# Patient Record
Sex: Male | Born: 1955 | Race: Black or African American | Hispanic: No | Marital: Single | Smoking: Never smoker
Health system: Southern US, Community
[De-identification: ages and names within clinical notes are randomized; demographics above are authoritative.]

## PROBLEM LIST (undated history)

## (undated) DIAGNOSIS — I10 Essential (primary) hypertension: Secondary | ICD-10-CM

## (undated) DIAGNOSIS — E119 Type 2 diabetes mellitus without complications: Secondary | ICD-10-CM

## (undated) DIAGNOSIS — E7800 Pure hypercholesterolemia, unspecified: Secondary | ICD-10-CM

## (undated) HISTORY — PX: APPENDECTOMY: SHX54

---

## 2001-02-28 ENCOUNTER — Inpatient Hospital Stay (HOSPITAL_COMMUNITY): Admission: EM | Admit: 2001-02-28 | Discharge: 2001-03-01 | Payer: Self-pay | Admitting: *Deleted

## 2001-03-03 ENCOUNTER — Other Ambulatory Visit (HOSPITAL_COMMUNITY): Admission: RE | Admit: 2001-03-03 | Discharge: 2001-03-05 | Payer: Self-pay | Admitting: Psychiatry

## 2001-06-11 ENCOUNTER — Emergency Department (HOSPITAL_COMMUNITY): Admission: EM | Admit: 2001-06-11 | Discharge: 2001-06-11 | Payer: Self-pay | Admitting: Emergency Medicine

## 2008-06-21 ENCOUNTER — Emergency Department (HOSPITAL_COMMUNITY): Admission: EM | Admit: 2008-06-21 | Discharge: 2008-06-22 | Payer: Self-pay | Admitting: Emergency Medicine

## 2011-02-07 NOTE — Discharge Summary (Signed)
[facility]  Patient:    Corey Christensen, Corey Christensen                      MRN: [id number] Adm. Date:  [id number] Disc. Date: [id number] Attending:  Benjaman Pott CC:         Intensive Outpatient Program   Discharge Summary  HISTORY OF PRESENT ILLNESS:  The patient was a 55 year old African-American male admitted on a voluntary basis due to depression.  The patient states he has been sad and hopeless for the past two weeks.  He has had multiple neurovegetative symptoms including anergia, anhedonia, difficulty concentrating, and feelings of worthlessness.  He is having conflict with this wife, which is causing stress.  He has left home for two days prior to admission, and been staying in a motel.  He had written a suicide note to his wife.  However, later, he stated that he did not really want to harm himself. There was no auditory or visual hallucinations.  PAST PSYCHIATRIC HISTORY:  None.  FAMILY HISTORY:  None.  SUBSTANCE ABUSE HISTORY:  Occasional drinker.  Occasional marijuana, last use two weeks ago.  PAST MEDICAL HISTORY:  The patient is status post knee surgery in February.  MEDICATIONS:  None.  DRUG ALLERGIES:  No known drug allergies.  For further assessment information, see psychiatric admission assessment.  PHYSICAL EXAMINATION:  This was performed at [facility].  There were no significant abnormalities.  ADMISSION LABORATORY:  Most were performed at [facility] and were grossly within normal limits.  UDS was noted to be positive for marijuana.  A hepatic profile was within normal limits, except for an elevated total protein of 8.2 (6-8), hypothyroid panel was within normal limits.  HOSPITAL COURSE:  The patient stayed for brief crisis stabilization.  He had a family session with his wife for support.  This went well.  He was not started on any medication.  He mood had improved significantly with the family  session intervention.  He was felt to be safe to be managed in an outpatient setting at the time of discharge.  DISCHARGE MENTAL STATUS EXAMINATION:  The patient was cooperative, pleasant, with good eye contact, alert, well-developed, African-American male.  Speech was normal rate and flow.  Psychomotor activities within normal limits.  Mood is euthymic.  Affect is superficially bright.  No homicidal or suicidal ideation.  The thought processes are logical and goal-directed.  No evidence of psychosis or auditory or visual hallucinations.  Cognitive exam intact. Judgment good.  Insight good.  DISCHARGE DIAGNOSES: Axis I:    Major depression, single, moderate, without psychosis. Axis II:   None. Axis III:  Status post knee arthroscopy. Axis IV:   Severe. Axis V:    Global Assessment of Functioning, admission, was 30, highest in the            past year was 80, at discharge is 50.  DISCHARGE MEDICATIONS:  None.  ACTIVITY LEVEL:  No activity restrictions.  DIET:  No dietary restrictions.  POSTHOSPITAL CARE PLAN:  IOP at the [facility]. DD:  04/01/01 TD:  04/01/01 Job: [id number]

## 2011-02-07 NOTE — H&P (Signed)
[facility]  Patient:    Corey Christensen, Corey Christensen                       MRN: [id number] Adm. Date:  [id number] Attending:  Jasmine Pang Dictator:   Candi Leash. Theressa Stamps, N.P.                   Psychiatric Admission Assessment  CHIEF COMPLAINT:  February 28, 2001  PATIENT IDENTIFICATION:  This is a 55 year old married black male voluntarily admitted to [facility] on February 28, 2001, for depression.  HISTORY OF PRESENT ILLNESS:  The patient presents with a history of depression for approximately two weeks, feeling "bad," sad, and hopeless.  The patient had written three letters to his wife, children, and parents.  His significant trigger to his depression has been conflicts with his wife.  The patient left home a couple of days prior to this admission, had stayed in [place].  He had written letters as stated and his wife had found them.  They imply suicidal ideation.  The patient knew that he needed some help.  He felt that they should have gotten counseling before.  He has been sleeping well.  His appetite is good.  He denies any auditory or visual hallucinations, denies any paranoid ideations.  He reports no other suicide attempts.  He states that he loves his family dearly and that he sees them as a deterrent to ever harming himself.  PAST PSYCHIATRIC HISTORY:  This is his first hospitalization.  He has no outpatient treatment.  SUBSTANCE ABUSE HISTORY:  He is a nonsmoker, occasional drinker, will have a couple of beers watching a sporting event.  He uses marijuana occasionally, last use two weeks ago.  PAST MEDICAL HISTORY:  Primary care [facility] is Dr. Jodelle Green in [place]. Medical problems: Status post arthroscopy in February 2002.  Medications: None.  Drug allergies: No known drug allergies.  Physical examination was performed at [facility].  Alcohol level was less than 9.  Urinalysis was within normal limits.  CMET: Within normal limits.   CBC was within normal limits.  Urine drug screen was positive for marijuana.  SOCIAL HISTORY:  He is a 55 year old married black male for 15 years.  He has three children ages 65, 35, and 47.  He lives with his children and spouse. 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  He has completed two years of community college.  He has no legal problems.  Family history: None.  MENTAL STATUS EXAMINATION:  Alert, well-developed black middle-aged male, pleasant, cooperative with good eye contact.  Speech is normal and relevant. Mood: "Feels good."  Affect is superficially bright.  Thought processes are coherent, no evidence of psychosis, no auditory or visual hallucinations, no suicidal or homicidal ideations, no paranoia.  Cognitive functioning is intact. Very goal-directed.  Memory is good.  Judgment is good.  Insight is fair.  Appears reliable.  ADMISSION DIAGNOSES: Axis I:    Major depression. Axis II:   Deferred. Axis III:  Status post knee arthroscopy. Axis IV:   Severe with problems relating to primary support group. Axis V:    Current is 30, past year 26.  INITIAL PLAN OF CARE:  Voluntary admission to [facility] for depression and suicidal ideation.  Contract for safety, check every 15 minutes; the patient promises safety.  Will discuss the use of antidepressant to decrease his depressive symptoms.  The patient does  not want to start at this point in time.  Consider family session for support.  Plan is to return to the patient to his prior living arrangement, for the patient to attend the IOP Program and couples counseling.  ESTIMATED LENGTH OF STAY:  Three to four days. DD:  03/01/01 TD:  03/01/01 Job: [id number]

## 2012-06-14 ENCOUNTER — Other Ambulatory Visit: Payer: Self-pay

## 2012-06-14 ENCOUNTER — Telehealth: Payer: Self-pay

## 2012-06-14 DIAGNOSIS — Z139 Encounter for screening, unspecified: Secondary | ICD-10-CM

## 2012-06-14 NOTE — Telephone Encounter (Signed)
MOVI PREP SPLIT DOSING, REGULAR BREAKFAST. CLEAR LIQUIDS AFTER 9 AM.  

## 2012-06-14 NOTE — Telephone Encounter (Addendum)
Gastroenterology Pre-Procedure Form  Pt had hx of drinking 1-2 beers on week-end. [place] Spaniel he has not drank in about a month.  Request Date: 06/14/2012        Requesting Physician: 06/29/2012     PATIENT INFORMATION:  Corey Christensen is a 56 y.o., male (DOB=Jun 03, 1956).  PROCEDURE: Procedure(s) requested: colonoscopy Procedure Reason: screening for colon cancer  PATIENT REVIEW QUESTIONS: The patient reports the following:   1. Diabetes Melitis: no 2. Joint replacements in the past 12 months: no 3. Major health problems in the past 3 months: no 4. Has an artificial valve or MVP:no 5. Has been advised in past to take antibiotics in advance of a procedure like teeth cleaning: no}    MEDICATIONS & ALLERGIES:    Patient reports the following regarding taking any blood thinners:   Plavix? no Aspirin?no Coumadin?  no  Patient confirms/reports the following medications:  Current Outpatient Prescriptions  Medication Sig Dispense Refill  . atenolol (TENORMIN) 50 MG tablet Take 50 mg by mouth daily.      Marland Kitchen lisinopril-hydrochlorothiazide (PRINZIDE,ZESTORETIC) 20-25 MG per tablet Take 1 tablet by mouth daily.      . pravastatin (PRAVACHOL) 20 MG tablet Take 20 mg by mouth daily.        Patient confirms/reports the following allergies:  No Known Allergies  Patient is appropriate to schedule for requested procedure(s): Yes  AUTHORIZATION INFORMATION Primary Insurance:   ID #: Group #:  Pre-Cert / Auth required Pre-Cert / Auth #:   Secondary Insurance:   ID #:   Group #:  Pre-Cert / Auth required: Pre-Cert / Auth #:   No orders of the defined types were placed in this encounter.    SCHEDULE INFORMATION: Procedure has been scheduled as follows:  Date: 06/29/2012     Time: 12:20 PM  Location: [facility] Short Stay  This Gastroenterology Pre-Precedure Form is being routed to the following provider(s) for review: Jonette Eva, MD

## 2012-06-15 MED ORDER — PEG-KCL-NACL-NASULF-NA ASC-C 100 G PO SOLR: 1.0000 | ORAL | Status: DC

## 2012-06-15 NOTE — Telephone Encounter (Signed)
Rx sent to [facility] and Instructions mailed to pt.  

## 2012-06-25 ENCOUNTER — Telehealth: Payer: Self-pay

## 2012-06-25 ENCOUNTER — Encounter (HOSPITAL_COMMUNITY): Payer: Self-pay | Admitting: Pharmacy Technician

## 2012-06-25 NOTE — Telephone Encounter (Signed)
I called LT ( last initial of G) on 06/17/2012 and he said that PA is not required for screening colonoscopy.

## 2012-06-28 ENCOUNTER — Telehealth: Payer: Self-pay

## 2012-06-28 MED ORDER — SODIUM CHLORIDE 0.45 % IV SOLN
INTRAVENOUS | Status: DC
  Administered 2012-06-29: 12:00:00 via INTRAVENOUS

## 2012-06-28 NOTE — Telephone Encounter (Signed)
LATE ENTRY;   I called and spoke to CT with last initial of G on 06/17/2012 and he said PA was not required for screening colonoscopy.

## 2012-06-29 ENCOUNTER — Encounter (HOSPITAL_COMMUNITY)
Admission: RE | Disposition: A | Discharge status: Home / Self Care | Payer: Self-pay | Source: Ambulatory Visit | Attending: Gastroenterology

## 2012-06-29 ENCOUNTER — Ambulatory Visit (HOSPITAL_COMMUNITY)
Admission: RE | Admit: 2012-06-29 | Discharge: 2012-06-29 | Disposition: A | Discharge status: Home / Self Care | Payer: No Typology Code available for payment source | Source: Ambulatory Visit | Attending: Gastroenterology | Admitting: Gastroenterology

## 2012-06-29 ENCOUNTER — Encounter (HOSPITAL_COMMUNITY): Payer: Self-pay | Admitting: *Deleted

## 2012-06-29 DIAGNOSIS — Z1211 Encounter for screening for malignant neoplasm of colon: Secondary | ICD-10-CM | POA: Insufficient documentation

## 2012-06-29 DIAGNOSIS — E7800 Pure hypercholesterolemia, unspecified: Secondary | ICD-10-CM | POA: Insufficient documentation

## 2012-06-29 DIAGNOSIS — Z139 Encounter for screening, unspecified: Secondary | ICD-10-CM

## 2012-06-29 DIAGNOSIS — K648 Other hemorrhoids: Secondary | ICD-10-CM | POA: Insufficient documentation

## 2012-06-29 DIAGNOSIS — I10 Essential (primary) hypertension: Secondary | ICD-10-CM | POA: Insufficient documentation

## 2012-06-29 HISTORY — PX: COLONOSCOPY: SHX5424

## 2012-06-29 HISTORY — DX: Pure hypercholesterolemia, unspecified: E78.00

## 2012-06-29 HISTORY — DX: Essential (primary) hypertension: I10

## 2012-06-29 SURGERY — COLONOSCOPY
Anesthesia: Moderate Sedation

## 2012-06-29 MED ORDER — MIDAZOLAM HCL 5 MG/5ML IJ SOLN
INTRAMUSCULAR | Status: AC
  Filled 2012-06-29: qty 10

## 2012-06-29 MED ORDER — MIDAZOLAM HCL 5 MG/5ML IJ SOLN
INTRAMUSCULAR | Status: DC | PRN
  Administered 2012-06-29 (×3): 2 mg via INTRAVENOUS

## 2012-06-29 MED ORDER — MEPERIDINE HCL 100 MG/ML IJ SOLN
INTRAMUSCULAR | Status: DC | PRN
  Administered 2012-06-29 (×3): 25 mg via INTRAVENOUS

## 2012-06-29 MED ORDER — STERILE WATER FOR IRRIGATION IR SOLN
Status: DC | PRN
  Administered 2012-06-29: 12:00:00

## 2012-06-29 MED ORDER — MEPERIDINE HCL 100 MG/ML IJ SOLN
INTRAMUSCULAR | Status: AC
  Filled 2012-06-29: qty 2

## 2012-06-29 NOTE — H&P (Signed)
  Primary Care Physician:  Evlyn Courier, MD Primary Gastroenterologist:  Dr. Darrick Penna  Pre-Procedure History & Physical: HPI:  Corey Christensen is a 56 y.o. male here for COLON CANCER SCREENING.   Past Medical History  Diagnosis Date  . Hypertension   . Hypercholesteremia     Past Surgical History  Procedure Date  . Appendectomy     Prior to Admission medications   Medication Sig Start Date End Date Taking? Authorizing Provider  aspirin EC 81 MG tablet Take 81 mg by mouth daily.   Yes Historical Provider, MD  atenolol (TENORMIN) 50 MG tablet Take 50 mg by mouth daily.   Yes Historical Provider, MD  lisinopril-hydrochlorothiazide (PRINZIDE,ZESTORETIC) 20-25 MG per tablet Take 1 tablet by mouth daily.   Yes Historical Provider, MD  pravastatin (PRAVACHOL) 20 MG tablet Take 20 mg by mouth daily.   Yes Historical Provider, MD    Allergies as of 06/14/2012  . (No Known Allergies)    Family History  Problem Relation Age of Onset  . Colon cancer Neg Hx     History   Social History  . Marital Status: Single    Spouse Name: N/A    Number of Children: N/A  . Years of Education: N/A   Occupational History  . Not on file.   Social History Main Topics  . Smoking status: Never Smoker   . Smokeless tobacco: Not on file  . Alcohol Use: No  . Drug Use: No  . Sexually Active:    Other Topics Concern  . Not on file   Social History Narrative  . No narrative on file    Review of Systems: See HPI, otherwise negative ROS   Physical Exam: BP 136/85  Pulse 58  Temp 97.9 F (36.6 C) (Oral)  Resp 23  Ht 6' (1.829 m)  Wt 197 lb (89.359 kg)  BMI 26.72 kg/m2  SpO2 93% General:   Alert,  pleasant and cooperative in NAD Head:  Normocephalic and atraumatic. Neck:  Supple; Lungs:  Clear throughout to auscultation.    Heart:  Regular rate and rhythm. Abdomen:  Soft, nontender and nondistended. Normal bowel sounds, without guarding, and without rebound.   Neurologic:  Alert  and  oriented x4;  grossly normal neurologically.  Impression/Plan:     SCREENING  Plan:  1. TCS TODAY

## 2012-06-30 NOTE — Op Note (Signed)
[facility] [address] [place], [address]   COLONOSCOPY PROCEDURE REPORT  PATIENT: Corey Christensen, Corey Christensen  MR#: [id number] BIRTHDATE: [birth year] , 55  yrs. old GENDER: Male ENDOSCOPIST: Jonette Eva, MD REFERRED MW:UXLKGM Hill, M.D. PROCEDURE DATE:  06/29/2012 PROCEDURE:   Colonoscopy, screening INDICATIONS:average risk patient for colon cancer. MEDICATIONS: Demerol 75 mg IV and Versed 6 mg IV  DESCRIPTION OF PROCEDURE:    Physical exam was performed.  Informed consent was obtained from the patient after explaining the benefits, risks, and alternatives to procedure.  The patient was connected to monitor and placed in left lateral position. Continuous oxygen was provided by nasal cannula and IV medicine administered through an indwelling cannula.  After administration of sedation and rectal exam, the patients rectum was intubated and the Pentax Colonoscope [id number]  colonoscope was advanced under direct visualization to the cecum.  The scope was removed slowly by carefully examining the color, texture, anatomy, and integrity mucosa on the way out.  The patient was recovered in endoscopy and discharged home in satisfactory condition.       COLON FINDINGS: Small internal hemorrhoids were found and The colon was otherwise normal.  There was no diverticulosis, inflammation, polyps or cancers unless previously stated.  PREP QUALITY: excellent. CECAL W/D TIME: 9 minutes  COMPLICATIONS: None  ENDOSCOPIC IMPRESSION: 1.   The colon mucosa was otherwise normal 2.   Small internal hemorrhoids   RECOMMENDATIONS: 1.  HIGH FIBER DIET TCS IN 10 YEARS        _______________________________ eSignedJonette Eva, MD 06/30/2012 1:57 PM

## 2012-07-08 ENCOUNTER — Encounter (HOSPITAL_COMMUNITY): Payer: Self-pay | Admitting: Gastroenterology

## 2013-08-08 ENCOUNTER — Encounter (HOSPITAL_COMMUNITY): Payer: Self-pay | Admitting: Emergency Medicine

## 2013-08-08 ENCOUNTER — Emergency Department (HOSPITAL_COMMUNITY)
Admission: EM | Admit: 2013-08-08 | Discharge: 2013-08-08 | Disposition: A | Discharge status: Home / Self Care | Payer: No Typology Code available for payment source | Attending: Emergency Medicine | Admitting: Emergency Medicine

## 2013-08-08 DIAGNOSIS — E7800 Pure hypercholesterolemia, unspecified: Secondary | ICD-10-CM | POA: Insufficient documentation

## 2013-08-08 DIAGNOSIS — Z7982 Long term (current) use of aspirin: Secondary | ICD-10-CM | POA: Insufficient documentation

## 2013-08-08 DIAGNOSIS — Y9389 Activity, other specified: Secondary | ICD-10-CM | POA: Insufficient documentation

## 2013-08-08 DIAGNOSIS — Z79899 Other long term (current) drug therapy: Secondary | ICD-10-CM | POA: Insufficient documentation

## 2013-08-08 DIAGNOSIS — I10 Essential (primary) hypertension: Secondary | ICD-10-CM | POA: Insufficient documentation

## 2013-08-08 DIAGNOSIS — Y92410 Unspecified street and highway as the place of occurrence of the external cause: Secondary | ICD-10-CM | POA: Insufficient documentation

## 2013-08-08 DIAGNOSIS — S46812A Strain of other muscles, fascia and tendons at shoulder and upper arm level, left arm, initial encounter: Secondary | ICD-10-CM

## 2013-08-08 DIAGNOSIS — S43499A Other sprain of unspecified shoulder joint, initial encounter: Secondary | ICD-10-CM | POA: Insufficient documentation

## 2013-08-08 MED ORDER — HYDROCODONE-ACETAMINOPHEN 5-325 MG PO TABS: 1.0000 | ORAL_TABLET | ORAL | Status: DC | PRN

## 2013-08-08 MED ORDER — DICLOFENAC SODIUM 75 MG PO TBEC: 75.0000 mg | DELAYED_RELEASE_TABLET | Freq: Two times a day (BID) | ORAL | Status: DC

## 2013-08-08 MED ORDER — METHOCARBAMOL 500 MG PO TABS: 500.0000 mg | ORAL_TABLET | Freq: Three times a day (TID) | ORAL | Status: DC
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
## 2013-08-08 NOTE — ED Provider Notes (Signed)
Medical screening examination/treatment/procedure(s) were performed by non-physician practitioner and as supervising physician I was immediately available for consultation/collaboration.  EKG Interpretation   None         Lyanne Co, MD 08/08/13 2333

## 2016-04-29 DIAGNOSIS — Z7982 Long term (current) use of aspirin: Secondary | ICD-10-CM | POA: Insufficient documentation

## 2016-04-29 DIAGNOSIS — E1165 Type 2 diabetes mellitus with hyperglycemia: Secondary | ICD-10-CM | POA: Insufficient documentation

## 2016-04-29 DIAGNOSIS — I10 Essential (primary) hypertension: Secondary | ICD-10-CM | POA: Diagnosis not present

## 2016-04-29 DIAGNOSIS — Z79899 Other long term (current) drug therapy: Secondary | ICD-10-CM | POA: Diagnosis not present

## 2016-04-30 ENCOUNTER — Emergency Department (HOSPITAL_COMMUNITY)
Admission: EM | Admit: 2016-04-30 | Discharge: 2016-04-30 | Disposition: A | Discharge status: Home / Self Care | Payer: No Typology Code available for payment source | Attending: Emergency Medicine | Admitting: Emergency Medicine

## 2016-04-30 ENCOUNTER — Encounter (HOSPITAL_COMMUNITY): Payer: Self-pay

## 2016-04-30 DIAGNOSIS — R739 Hyperglycemia, unspecified: Secondary | ICD-10-CM

## 2016-04-30 DIAGNOSIS — E860 Dehydration: Secondary | ICD-10-CM

## 2016-04-30 HISTORY — DX: Type 2 diabetes mellitus without complications: E11.9

## 2016-04-30 LAB — CBC WITH DIFFERENTIAL/PLATELET
BASOS ABS: 0.1 10*3/uL (ref 0.0–0.1)
BASOS PCT: 1 %
EOS PCT: 4 %
Eosinophils Absolute: 0.2 10*3/uL (ref 0.0–0.7)
HEMATOCRIT: 42.6 % (ref 39.0–52.0)
Hemoglobin: 13.2 g/dL (ref 13.0–17.0)
LYMPHS PCT: 35 %
Lymphs Abs: 2 10*3/uL (ref 0.7–4.0)
MCH: 28.4 pg (ref 26.0–34.0)
MCHC: 31 g/dL (ref 30.0–36.0)
MCV: 91.6 fL (ref 78.0–100.0)
MONO ABS: 0.5 10*3/uL (ref 0.1–1.0)
MONOS PCT: 10 %
NEUTROS ABS: 2.8 10*3/uL (ref 1.7–7.7)
Neutrophils Relative %: 50 %
PLATELETS: 232 10*3/uL (ref 150–400)
RBC: 4.65 MIL/uL (ref 4.22–5.81)
RDW: 13.3 % (ref 11.5–15.5)
WBC: 5.6 10*3/uL (ref 4.0–10.5)

## 2016-04-30 LAB — CBG MONITORING, ED
Glucose-Capillary: 414 mg/dL — ABNORMAL HIGH (ref 65–99)
Glucose-Capillary: >600 mg/dL (ref 65–99)

## 2016-04-30 LAB — BASIC METABOLIC PANEL
Anion gap: 7 (ref 5–15)
BUN: 9 mg/dL (ref 6–20)
CALCIUM: 8.9 mg/dL (ref 8.9–10.3)
CO2: 29 mmol/L (ref 22–32)
CREATININE: 0.84 mg/dL (ref 0.61–1.24)
Chloride: 90 mmol/L — ABNORMAL LOW (ref 101–111)
GFR calc Af Amer: >60 mL/min (ref >60)
GLUCOSE: 572 mg/dL — AB (ref 65–99)
Potassium: 3.9 mmol/L (ref 3.5–5.1)
Sodium: 126 mmol/L — ABNORMAL LOW (ref 135–145)

## 2016-04-30 MED ORDER — SODIUM CHLORIDE 0.9 % IV BOLUS (SEPSIS)
1000.0000 mL | Freq: Once | INTRAVENOUS | Status: AC
  Administered 2016-04-30: 1000 mL via INTRAVENOUS

## 2016-04-30 MED ORDER — SODIUM CHLORIDE 0.9 % IV SOLN
Freq: Once | INTRAVENOUS | Status: AC
  Administered 2016-04-30: 01:00:00 via INTRAVENOUS

## 2016-04-30 NOTE — ED Notes (Signed)
IV access was obtained at 0100

## 2016-04-30 NOTE — ED Provider Notes (Signed)
[facility] Provider Note   CSN: [id number] Arrival date & time: 04/29/16  2348  First Provider Contact:  First MD Initiated Contact with Patient 04/30/16 0215        History   Chief Complaint Chief Complaint  Patient presents with  . Hyperglycemia    HPI Corey ChartersStephen R Schnabel is a 60 y.o. male.  The history is provided by the patient.  Hyperglycemia  Severity:  Moderate Onset quality:  Gradual Duration:  1 day Timing:  Constant Progression:  Unchanged Chronicity:  New Current diabetic therapy:  Just started janumet Context: new diabetes diagnosis   Relieved by:  Nothing Ineffective treatments:  None tried Associated symptoms: polyuria   Associated symptoms: no abdominal pain, no chest pain, no dizziness, no fever, no shortness of breath and no vomiting   patient just diagnosed with diabetes by PCP yesterday He just started janumet Today he noted glucose read as high No other new symptoms He has not changed his diet since his diagnosis  Past Medical History:  Diagnosis Date  . Diabetes mellitus without complication (HCC)   . Hypercholesteremia   . Hypertension     There are no active problems to display for this patient.   Past Surgical History:  Procedure Laterality Date  . APPENDECTOMY    . COLONOSCOPY  06/29/2012   Procedure: COLONOSCOPY;  Surgeon: West BaliSandi L Fields, MD;  Location: [facility];  Service: Endoscopy;  Laterality: N/A;  12:20 PM       Home Medications    Prior to Admission medications   Medication Sig Start Date End Date Taking? Authorizing Provider  aspirin EC 81 MG tablet Take 81 mg by mouth daily.    Historical Provider, MD  atenolol (TENORMIN) 50 MG tablet Take 50 mg by mouth daily.    Historical Provider, MD  diclofenac (VOLTAREN) 75 MG EC tablet Take 1 tablet (75 mg total) by mouth 2 (two) times daily. 08/08/13   Ivery QualeHobson Bryant, PA-C  HYDROcodone-acetaminophen (NORCO) 5-325 MG per tablet Take 1 tablet by mouth every 4 (four)  hours as needed for moderate pain. 08/08/13   Ivery QualeHobson Bryant, PA-C  lisinopril-hydrochlorothiazide (PRINZIDE,ZESTORETIC) 20-25 MG per tablet Take 1 tablet by mouth daily.    Historical Provider, MD  methocarbamol (ROBAXIN) 500 MG tablet Take 1 tablet (500 mg total) by mouth 3 (three) times daily. 08/08/13   Ivery QualeHobson Bryant, PA-C  pravastatin (PRAVACHOL) 20 MG tablet Take 20 mg by mouth daily.    Historical Provider, MD    Family History Family History  Problem Relation Age of Onset  . Colon cancer Neg Hx     Social History Social History  Substance Use Topics  . Smoking status: Never Smoker  . Smokeless tobacco: Not on file  . Alcohol use Yes     Allergies   Review of patient's allergies indicates no known allergies.   Review of Systems Review of Systems  Constitutional: Negative for fever.  Respiratory: Negative for shortness of breath.   Cardiovascular: Negative for chest pain.  Gastrointestinal: Negative for abdominal pain and vomiting.  Endocrine: Positive for polyuria.  Neurological: Negative for dizziness.  All other systems reviewed and are negative.    Physical Exam Updated Vital Signs BP 151/97   Pulse 70   Temp 98.3 F (36.8 C) (Oral)   Ht 6' (1.829 m)   Wt 76.7 kg   SpO2 100%   BMI 22.92 kg/m   Physical Exam CONSTITUTIONAL: Well developed/well nourished HEAD: Normocephalic/atraumatic EYES: EOMI/PERRL ENMT: Mucous  membranes moist NECK: supple no meningeal signs SPINE/BACK:entire spine nontender CV: S1/S2 noted, no murmurs/rubs/gallops noted LUNGS: Lungs are clear to auscultation bilaterally, no apparent distress ABDOMEN: soft, nontender, no rebound or guarding, bowel sounds noted throughout abdomen GU:no cva tenderness NEURO: Pt is awake/alert/appropriate, moves all extremitiesx4.  No facial droop.   EXTREMITIES: pulses normal/equal, full ROM SKIN: warm, color normal PSYCH: no abnormalities of mood noted, alert and oriented to situation   ED  Treatments / Results  Labs (all labs ordered are listed, but only abnormal results are displayed) Labs Reviewed  BASIC METABOLIC PANEL - Abnormal; Notable for the following:       Result Value   Sodium 126 (*)    Chloride 90 (*)    Glucose, Bld 572 (*)    All other components within normal limits  CBG MONITORING, ED - Abnormal; Notable for the following:    Glucose-Capillary 414 (*)    All other components within normal limits  CBC WITH DIFFERENTIAL/PLATELET    EKG  EKG Interpretation None       Radiology No results found.  Procedures Procedures (including critical care time)  Medications Ordered in ED Medications  0.9 %  sodium chloride infusion ( Intravenous Stopped 04/30/16 0219)  sodium chloride 0.9 % bolus 1,000 mL (0 mLs Intravenous Stopped 04/30/16 0344)     Initial Impression / Assessment and Plan / ED Course  I have reviewed the triage vital signs and the nursing notes.  Pertinent labs & imaging results that were available during my care of the patient were reviewed by me and considered in my medical decision making (see chart for details).  Clinical Course  Value Comment By Time  Glucose: (!!) 572 Hyperglycemia without anion gap Zadie Rhine, MD 08/09 0250  Sodium: (!) 126 dehydration Zadie Rhine, MD 08/09 0250    Pt well appearing He has asymptomatic hyperglycemia No signs of DKA Pt not vomiting He can continue home meds, advised on diet changes and PCP followup  Final Clinical Impressions(s) / ED Diagnoses   Final diagnoses:  Hyperglycemia  Dehydration    New Prescriptions New Prescriptions   No medications on file     Zadie Rhine, MD 04/30/16 [phone]

## 2016-04-30 NOTE — ED Notes (Signed)
CRITICAL VALUE ALERT  Critical value received:  Blood glucose-  572  Date of notification:  04/30/16  Time of notification:  0243  Critical value read back:Yes.    Nurse who received alert:  Meridee Scoreharlotte Arzell Mcgeehan, RN  MD notified (1st page):  [phone]  Time of first page:  0245  MD notified (2nd page):  Time of second page:  Responding MD:  Dr Bebe ShaggyWickline  Time MD responded:  [phone]

## 2016-04-30 NOTE — ED Triage Notes (Signed)
Pt reports he was just worried about high reading on blood glucose meter. Newly diagnosed with diabetes yesterday and started on Janumet.

## 2016-04-30 NOTE — ED Notes (Signed)
Triage completed by Bronson CurbKristy Belton, RN.

## 2016-05-15 ENCOUNTER — Encounter: Payer: No Typology Code available for payment source | Attending: Family Medicine | Admitting: Nutrition

## 2016-05-15 VITALS — Ht 71.0 in | Wt 163.0 lb

## 2016-05-15 DIAGNOSIS — Z6822 Body mass index (BMI) 22.0-22.9, adult: Secondary | ICD-10-CM | POA: Diagnosis not present

## 2016-05-15 DIAGNOSIS — E118 Type 2 diabetes mellitus with unspecified complications: Secondary | ICD-10-CM

## 2016-05-15 DIAGNOSIS — E119 Type 2 diabetes mellitus without complications: Secondary | ICD-10-CM | POA: Diagnosis not present

## 2016-05-15 DIAGNOSIS — Z713 Dietary counseling and surveillance: Secondary | ICD-10-CM | POA: Diagnosis not present

## 2016-05-15 DIAGNOSIS — E1165 Type 2 diabetes mellitus with hyperglycemia: Secondary | ICD-10-CM

## 2016-05-15 DIAGNOSIS — R634 Abnormal weight loss: Secondary | ICD-10-CM

## 2016-05-15 DIAGNOSIS — IMO0002 Reserved for concepts with insufficient information to code with codable children: Secondary | ICD-10-CM

## 2016-05-15 NOTE — Patient Instructions (Signed)
Goals Plan:  Aim for 3-4Carb Choices per meal (45-60 grams) +/- 1 either way  Avoid snacks between meals. Carbs per snack if hungry  Include protein in moderation with your meals and snacks Consider reading food labels for Total Carbohydrate and Fat Grams of foods Consider  increasing your activity level by 30-60 minutes daily as tolerated Consider checking BG at alternate times per day as directed by MD  Consider taking medication Janumet as directed by MD Eat three meals on time per My Plate Method Get A1C down to 7%.

## 2016-05-15 NOTE — Progress Notes (Signed)
Diabetes Self-Management Education  Visit Type: First/Initial  Appt. Start Time: 800 Appt. End Time: 900  05/15/2016  Mr. Corey Christensen, identified by name and date of birth, is a 60 y.o. male with a diagnosis of Diabetes: Type 2.  He has lost about 20-25 lbs. Just diagnosed about 2 months ago. Has had extreme thirst, frequent urination, blurry vision and weight loss and chronic fatigue. Retired 2-3 months ago from [employer], [employer]  ASSESSMENT  Height 5\' 11"  (1.803 m), weight 163 lb (73.9 kg). Body mass index is 22.73 kg/m.      Diabetes Self-Management Education - 05/15/16 0815      Visit Information   Visit Type First/Initial     Initial Visit   Diabetes Type Type 2   Are you currently following a meal plan? No   Are you taking your medications as prescribed? Yes   Date Diagnosed June 2017     Health Coping   How would you rate your overall health? Good     Psychosocial Assessment   Patient Belief/Attitude about Diabetes Motivated to manage diabetes   Self-care barriers None   Self-management support Doctor's office;Family   Other persons present Patient   Patient Concerns Nutrition/Meal planning;Monitoring;Healthy Lifestyle;Glycemic Control;Weight Control;Medication   Special Needs None   Preferred Learning Style No preference indicated   Learning Readiness Ready   How often do you need to have someone help you when you read instructions, pamphlets, or other written materials from your doctor or pharmacy? 1 - Never   What is the last grade level you completed in school? 12     Pre-Education Assessment   Patient understands the diabetes disease and treatment process. Needs Instruction   Patient understands incorporating nutritional management into lifestyle. Needs Instruction   Patient undertands incorporating physical activity into lifestyle. Needs Instruction   Patient understands using medications safely. Needs Instruction   Patient understands monitoring blood  glucose, interpreting and using results Needs Instruction   Patient understands prevention, detection, and treatment of acute complications. Needs Instruction   Patient understands prevention, detection, and treatment of chronic complications. Needs Instruction   Patient understands how to develop strategies to address psychosocial issues. Needs Instruction   Patient understands how to develop strategies to promote health/change behavior. Needs Instruction     Complications   Last HgB A1C per patient/outside source 14 %   How often do you check your blood sugar? 1-2 times/day   Fasting Blood glucose range (mg/dL) 161-096   Postprandial Blood glucose range (mg/dL) 045-409   Number of hypoglycemic episodes per month 0   Number of hyperglycemic episodes per week 10   Can you tell when your blood sugar is high? No   Have you had a dilated eye exam in the past 12 months? No   Have you had a dental exam in the past 12 months? No   Are you checking your feet? No     Dietary Intake   Breakfast Raisin bran or cherrios or frosted flakes or EGGs and low sodium,  coffee or water or unsweet tea or crystals light   Snack (morning) peanuts   Lunch Fried chicken -2, pinto beans 1/2-1 cup ,  unsweet tea and water   Dinner Fish- 2 pc fried, toss salad with [place], water   Beverage(s) water, unsweet tea or crystal light     Exercise   Exercise Type Light (walking / raking leaves)   How many days per week to you exercise? 3  How many minutes per day do you exercise? 30   Total minutes per week of exercise 90     Patient Education   Previous Diabetes Education No   Disease state  Definition of diabetes, type 1 and 2, and the diagnosis of diabetes;Factors that contribute to the development of diabetes;Explored patient's options for treatment of their diabetes   Nutrition management  Role of diet in the treatment of diabetes and the relationship between the three main macronutrients and blood  glucose level;Food label reading, portion sizes and measuring food.;Carbohydrate counting;Reviewed blood glucose goals for pre and post meals and how to evaluate the patients' food intake on their blood glucose level.;Meal timing in regards to the patients' current diabetes medication.   Physical activity and exercise  Role of exercise on diabetes management, blood pressure control and cardiac health.;Identified with patient nutritional and/or medication changes necessary with exercise.;Helped patient identify appropriate exercises in relation to his/her diabetes, diabetes complications and other health issue.   Medications Reviewed patients medication for diabetes, action, purpose, timing of dose and side effects.;Reviewed medication adjustment guidelines for hyperglycemia and sick days.   Monitoring Taught/evaluated SMBG meter.;Taught/discussed recording of test results and interpretation of SMBG.;Identified appropriate SMBG and/or A1C goals.;Daily foot exams;Interpreting lab values - A1C, lipid, urine microalbumina.;Purpose and frequency of SMBG.   Acute complications Trained/discussed glucagon administration to patient and designated other.;Taught treatment of hypoglycemia - the 15 rule.;Discussed and identified patients' treatment of hyperglycemia.   Chronic complications Relationship between chronic complications and blood glucose control;Lipid levels, blood glucose control and heart disease;Dental care;Nephropathy, what it is, prevention of, the use of ACE, ARB's and early detection of through urine microalbumia.;Assessed and discussed foot care and prevention of foot problems;Identified and discussed with patient  current chronic complications;Retinopathy and reason for yearly dilated eye exams;Reviewed with patient heart disease, higher risk of, and prevention   Psychosocial adjustment Worked with patient to identify barriers to care and solutions;Role of stress on diabetes;Helped patient identify a  support system for diabetes management;Identified and addressed patients feelings and concerns about diabetes   Personal strategies to promote health Lifestyle issues that need to be addressed for better diabetes care;Helped patient develop diabetes management plan for (enter comment)     Individualized Goals (developed by patient)   Nutrition Follow meal plan discussed;General guidelines for healthy choices and portions discussed;Adjust meds/carbs with exercise as discussed   Physical Activity Exercise 3-5 times per week;60 minutes per day   Medications take my medication as prescribed   Monitoring  test my blood glucose as discussed   Reducing Risk examine blood glucose patterns;get labs drawn;do foot checks daily   Health Coping --  meal planning     Post-Education Assessment   Patient understands the diabetes disease and treatment process. Needs Review   Patient understands incorporating nutritional management into lifestyle. Needs Review   Patient undertands incorporating physical activity into lifestyle. Needs Review   Patient understands using medications safely. Needs Review   Patient understands monitoring blood glucose, interpreting and using results Needs Review   Patient understands prevention, detection, and treatment of acute complications. Needs Review   Patient understands prevention, detection, and treatment of chronic complications. Needs Review   Patient understands how to develop strategies to address psychosocial issues. Needs Review   Patient understands how to develop strategies to promote health/change behavior. Needs Review     Outcomes   Expected Outcomes Demonstrated interest in learning. Expect positive outcomes   Future DMSE 4-6 wks   Program  Status Completed      Individualized Plan for Diabetes Self-Management Training:   Learning Objective:  Patient will have a greater understanding of diabetes self-management. Patient education plan is to attend  individual and/or group sessions per assessed needs and concerns.   Plan:  Goals Plan:  Aim for 3-4Carb Choices per meal (45-60 grams) +/- 1 either way  Avoid snacks between meals. Carbs per snack if hungry  Include protein in moderation with your meals and snacks Consider reading food labels for Total Carbohydrate and Fat Grams of foods Consider  increasing your activity level by 30-60 minutes daily as tolerated Consider checking BG at alternate times per day as directed by MD  Consider taking medication Janumet as directed by MD Eat three meals on time per My Plate Method Get A1C down to 7%. Expected Outcomes:  Demonstrated interest in learning. Expect positive outcomes  Education material provided: Living Well with Diabetes, A1C conversion sheet, Meal plan card and My Plate  If problems or questions, patient to contact team via:  Phone and Email  Future DSME appointment: 4-6 wks

## 2016-05-21 ENCOUNTER — Encounter: Payer: Self-pay | Admitting: Nutrition

## 2016-06-12 ENCOUNTER — Encounter: Payer: Self-pay | Admitting: Nutrition

## 2016-06-12 ENCOUNTER — Encounter: Payer: No Typology Code available for payment source | Attending: Family Medicine | Admitting: Nutrition

## 2016-06-12 VITALS — Ht 72.0 in | Wt 172.0 lb

## 2016-06-12 DIAGNOSIS — Z713 Dietary counseling and surveillance: Secondary | ICD-10-CM | POA: Insufficient documentation

## 2016-06-12 DIAGNOSIS — E119 Type 2 diabetes mellitus without complications: Secondary | ICD-10-CM | POA: Insufficient documentation

## 2016-06-12 DIAGNOSIS — E1165 Type 2 diabetes mellitus with hyperglycemia: Secondary | ICD-10-CM

## 2016-06-12 DIAGNOSIS — E118 Type 2 diabetes mellitus with unspecified complications: Secondary | ICD-10-CM

## 2016-06-12 DIAGNOSIS — IMO0002 Reserved for concepts with insufficient information to code with codable children: Secondary | ICD-10-CM

## 2016-06-12 DIAGNOSIS — Z6822 Body mass index (BMI) 22.0-22.9, adult: Secondary | ICD-10-CM | POA: Insufficient documentation

## 2016-06-12 NOTE — Patient Instructions (Signed)
Goals 1. Keep up the great job 2. Add more more veggies and fruits to meals 3. Exercise  30-60 minutes  3-4 times per week 4. Get A1C to 6% 5. Gain 1/2 lbs per week

## 2016-06-12 NOTE — Progress Notes (Signed)
Diabetes Self-Management Education  Visit Type:    Appt. Start Time: 800 Appt. End Time: 900  06/12/2016  Diabetes Self-Management Education  Visit Type:  Follow-up  Appt. Start Time: 1000 Appt. End Time: 1030  06/12/2016  Mr. Corey Christensen, identified by name and date of birth, is a 60 y.o. male with a diagnosis of Diabetes:  .He has made significant changes. FBS less than 120, bedtime less than 150. He has cut out all junk food, salty, sweets and eating meals on time and drinking water only.  Feels much better, his vision is better and he has a lot more energy. Goes to see DR. Hill on Monday. Estimated AVG BS is 120's.. Gained 9 lbs back. He desires to get back to about 190 lbs.     ASSESSMENT  Height 6' (1.829 m), weight 172 lb (78 kg). Body mass index is 23.33 kg/m.       Diabetes Self-Management Education - 06/12/16 1126      Psychosocial Assessment   Patient Concerns Healthy Lifestyle;Glycemic Control;Weight Control     Complications   Last HgB A1C per patient/outside source 6.5 %   How often do you check your blood sugar? 1-2 times/day   Fasting Blood glucose range (mg/dL) 54-09870-129   Postprandial Blood glucose range (mg/dL) 11-91470-129   Number of hypoglycemic episodes per month 0   Number of hyperglycemic episodes per week 0   Can you tell when your blood sugar is high? Yes   Have you had a dilated eye exam in the past 12 months? Yes   Have you had a dental exam in the past 12 months? No   Are you checking your feet? Yes   How many days per week are you checking your feet? 7     Dietary Intake   Breakfast same breakfast   Lunch [place] sandwich on ww bread, water, fruit or popcorn   Chartered loss adjusterDinner Pork chop, pinto beans,, water   Beverage(s) water     Exercise   Exercise Type Light (walking / raking leaves)   How many days per week to you exercise? 4   How many minutes per day do you exercise? 45   Total minutes per week of exercise 180     Patient Education   Previous Diabetes Education Yes (please comment)   Nutrition management  Meal timing in regards to the patients' current diabetes medication.;Reviewed blood glucose goals for pre and post meals and how to evaluate the patients' food intake on their blood glucose level.;Carbohydrate counting;Food label reading, portion sizes and measuring food.   Monitoring Interpreting lab values - A1C, lipid, urine microalbumina.;Taught/discussed recording of test results and interpretation of SMBG.;Identified appropriate SMBG and/or A1C goals.     Individualized Goals (developed by patient)   Nutrition Follow meal plan discussed;General guidelines for healthy choices and portions discussed   Physical Activity Exercise 3-5 times per week;45 minutes per day   Medications take my medication as prescribed   Monitoring  test my blood glucose as discussed   Reducing Risk examine blood glucose patterns     Patient Self-Evaluation of Goals - Patient rates self as meeting previously set goals (% of time)   Nutrition >75%   Physical Activity >75%   Medications >75%   Monitoring >75%   Problem Solving >75%   Reducing Risk >75%   Health Coping >75%     Post-Education Assessment   Patient understands the diabetes disease and treatment process. Demonstrates understanding / competency  Patient understands incorporating nutritional management into lifestyle. Demonstrates understanding / competency   Patient undertands incorporating physical activity into lifestyle. Demonstrates understanding / competency   Patient understands using medications safely. Demonstrates understanding / competency   Patient understands monitoring blood glucose, interpreting and using results Demonstrates understanding / competency   Patient understands prevention, detection, and treatment of acute complications. Demonstrates understanding / competency   Patient understands prevention, detection, and treatment of chronic complications.  Demonstrates understanding / competency   Patient understands how to develop strategies to address psychosocial issues. Demonstrates understanding / competency   Patient understands how to develop strategies to promote health/change behavior. Demonstrates understanding / competency     Outcomes   Program Status Completed     Subsequent Visit   Since your last visit have you continued or begun to take your medications as prescribed? Yes   Since your last visit have you had your blood pressure checked? Yes   Is your most recent blood pressure lower, unchanged, or higher since your last visit? Lower   Since your last visit have you experienced any weight changes? Gain   Weight Gain (lbs) 7   Since your last visit, are you checking your blood glucose at least once a day? Yes      Learning Objective:  Patient will have a greater understanding of diabetes self-management. Patient education plan is to attend individual and/or group sessions per assessed needs and concerns.   Plan:   Patient Instructions  Goals 1. Keep up the great job 2. Add more more veggies and fruits to meals 3. Exercise  30-60 minutes  3-4 times per week 4. Get A1C to 6% 5. Gain 1/2 lbs per week     Expected Outcomes:  Demonstrated interest in learning. Expect positive outcomes  Education material provided: My Plate and Carbohydrate counting sheet  If problems or questions, patient to contact team via:  Phone and Email  Future DSME appointment: - 3-4 months

## 2016-09-11 ENCOUNTER — Encounter: Payer: No Typology Code available for payment source | Attending: Family Medicine | Admitting: Nutrition

## 2016-09-11 ENCOUNTER — Encounter: Payer: Self-pay | Admitting: Nutrition

## 2016-09-11 VITALS — Ht 72.0 in | Wt 180.0 lb

## 2016-09-11 DIAGNOSIS — E1165 Type 2 diabetes mellitus with hyperglycemia: Secondary | ICD-10-CM

## 2016-09-11 DIAGNOSIS — E118 Type 2 diabetes mellitus with unspecified complications: Principal | ICD-10-CM

## 2016-09-11 DIAGNOSIS — IMO0002 Reserved for concepts with insufficient information to code with codable children: Secondary | ICD-10-CM

## 2016-09-11 NOTE — Progress Notes (Signed)
  Diabetes Self-Management Education  Visit Type:  Follow-up  Appt. Start Time: 930 Appt. End Time: 1000  09/11/2016  Mr. Corey Christensen, identified by name and date of birth, is a 60 y.o. male with a diagnosis of Diabetes Type 2.  Doing very well. Eating three meals per day. BS are WNL. NO low blood sugars. FBS 90-120's. Janument 50/500 BID. Sees Dr. Loleta ChanceHIll again in Feb. 2018. Requested copies of his last labs. He thinks A1C was about 6%. .   ASSESSMENT  Height 6' (1.829 m), weight 180 lb (81.6 kg). Body mass index is 24.41 kg/m.       Diabetes Self-Management Education - 09/11/16 0945      Health Coping   How would you rate your overall health? Good     Dietary Intake   Breakfast apple, and raisin bran with milk   Lunch Sandwich grilled chicken, popcorn and gram crackers, water   Dinner Delta Air LinesBaked chicken, green beans, mashed potatoes, water   Snack (evening) peanuts   Beverage(s) water     Exercise   Exercise Type Light (walking / raking leaves)   How many days per week to you exercise? 3     Subsequent Visit   Since your last visit have you continued or begun to take your medications as prescribed? (P)  Yes   Since your last visit have you had your blood pressure checked? (P)  Yes   Is your most recent blood pressure lower, unchanged, or higher since your last visit? (P)  Lower   Since your last visit have you experienced any weight changes? (P)  Gain      Learning Objective:  Patient will have a greater understanding of diabetes self-management. Patient education plan is to attend individual and/or group sessions per assessed needs and concerns.   Plan:   Patient Instructions  Goals 1. Keep following Plate Method 2. Use Mrs. Dash for seasoning 3. Exercise 30 mintutes of walking 4. Continue drinking water 5. Focus on about  60-75 grams of carbs per meal depending on activity. 6. Keep  taking Janumet as prescribed. 7. Schedule appt for eye exam.. Keep A1C 6% or  less    Expected Outcomes:     Education material provided: My Plate and Carbohydrate counting sheet  If problems or questions, patient to contact team via:  Phone and Email  Future DSME appointment: -   6 months

## 2016-09-11 NOTE — Patient Instructions (Addendum)
Goals 1. Keep following Plate Method 2. Use Mrs. Dash for seasoning 3. Exercise 30 mintutes of walking 4. Continue drinking water 5. Focus on aboug 60-75 grams of carbs per meal depending on activity. 6. Keepi taking Janumet as prescribed. 7. Schedule appt for eye exma. Keep A1C 6% or less

## 2017-03-02 ENCOUNTER — Ambulatory Visit: Payer: No Typology Code available for payment source | Admitting: Nutrition

## 2017-03-09 ENCOUNTER — Encounter: Payer: No Typology Code available for payment source | Attending: Family Medicine | Admitting: Nutrition

## 2017-03-09 VITALS — Ht 71.0 in | Wt 170.0 lb

## 2017-03-09 DIAGNOSIS — Z713 Dietary counseling and surveillance: Secondary | ICD-10-CM | POA: Insufficient documentation

## 2017-03-09 DIAGNOSIS — R634 Abnormal weight loss: Secondary | ICD-10-CM | POA: Insufficient documentation

## 2017-03-09 DIAGNOSIS — E119 Type 2 diabetes mellitus without complications: Secondary | ICD-10-CM | POA: Insufficient documentation

## 2017-03-09 NOTE — Progress Notes (Signed)
  Diabetes Self-Management Education  Visit Type:     Appt. Start Time: 930 Appt. End Time: 1000  03/09/2017  Mr. Toniann KetStephen Avellino, identified by name and date of birth, is a 61 y.o. male with a diagnosis of Diabetes Type 2.  Doing very well. Eating three meals per day. BS are WNL. NO low blood sugars. FBS 80-100's. Janument 50/500 BID. Sees Dr. Loleta ChanceHIll again in  July  2018. Requested copies of his last labs.     .   ASSESSMENT  Height 5\' 11"  (1.803 m), weight 170 lb (77.1 kg). Body mass index is 23.71 kg/m.        Diabetes Self-Management Education - 03/09/17 0910      Health Coping   How would you rate your overall health? Excellent     Complications   How often do you check your blood sugar? (P)  3-4 times / week   Fasting Blood glucose range (mg/dL) (P)  [phone]   Number of hypoglycemic episodes per month (P)  --  none   Number of hyperglycemic episodes per week (P)  --  none   Have you had a dilated eye exam in the past 12 months? (P)  Yes   Have you had a dental exam in the past 12 months? (P)  Yes   Are you checking your feet? (P)  Yes     Dietary Intake   Breakfast Raisin bran cereal with milk and eggs, bacon, water, coffee   Lunch 2 pc chicken, corn, water   Dinner Malawiturkey LT sandwich, Market researcherwater   Beverage(s) water     Exercise   Exercise Type Light (walking / raking leaves)   How many days per week to you exercise? --  5   How many minutes per day do you exercise? --  60     Subsequent Visit   Since your last visit have you continued or begun to take your medications as prescribed? Yes   Since your last visit have you had your blood pressure checked? Yes   Is your most recent blood pressure lower, unchanged, or higher since your last visit? Lower      Learning Objective:  Patient will have a greater understanding of diabetes self-management. Patient education plan is to attend individual and/or group sessions per assessed needs and concerns.   Plan:    Patient Instructions  Goals 1. Keep A1C to 6% or less 2 Prevent low blood sugars 3. Increase vegetables to 2 svg with meals 4. Increase carbs to 3-4 carb choices per meal Maintain weight.      Expected Outcomes:     Education material provided: My Plate and Carbohydrate counting sheet  If problems or questions, patient to contact team via:  Phone and Email  Future DSME appointment: -   6 months.

## 2017-03-09 NOTE — Patient Instructions (Signed)
Goals 1. Keep A1C to 6% or less 2 Prevent low blood sugars 3. Increase vegetables to 2 svg with meals 4. Increase carbs to 3-4 carb choices per meal Maintain weight.

## 2017-09-09 ENCOUNTER — Encounter: Payer: No Typology Code available for payment source | Attending: Family Medicine | Admitting: Nutrition

## 2017-09-24 ENCOUNTER — Telehealth: Payer: Self-pay | Admitting: Nutrition

## 2017-09-24 NOTE — Telephone Encounter (Signed)
VM to call and reschedule missed appt. 

## 2019-12-15 ENCOUNTER — Ambulatory Visit: Payer: No Typology Code available for payment source | Attending: Internal Medicine

## 2019-12-15 DIAGNOSIS — Z23 Encounter for immunization: Secondary | ICD-10-CM

## 2019-12-15 NOTE — Progress Notes (Signed)
   Covid-19 Vaccination Clinic  Name:  JAMONT MELLIN    MRN: [id number] DOB: [birth year]  12/15/2019  Mr. Andujar was observed post Covid-19 immunization for 15 minutes without incident. He was provided with Vaccine Information Sheet and instruction to access the V-Safe system.   Mr. Alms was instructed to call 911 with any severe reactions post vaccine: Marland Kitchen Difficulty breathing  . Swelling of face and throat  . A fast heartbeat  . A bad rash all over body  . Dizziness and weakness   Immunizations Administered    Name Date Dose VIS Date Route   Moderna COVID-19 Vaccine 12/15/2019  9:04 AM 0.5 mL 08/23/2019 Intramuscular   Manufacturer: Moderna   Lot: [id number]   NDC: 12811-886-77

## 2020-01-17 ENCOUNTER — Ambulatory Visit: Payer: No Typology Code available for payment source | Attending: Internal Medicine

## 2020-01-17 DIAGNOSIS — Z23 Encounter for immunization: Secondary | ICD-10-CM

## 2020-01-17 NOTE — Progress Notes (Signed)
   Covid-19 Vaccination Clinic  Name:  Corey Christensen    MRN: [id number] DOB: 1956-04-22  01/17/2020  Mr. Corey Christensen was observed post Covid-19 immunization for 15 minutes without incident. He was provided with Vaccine Information Sheet and instruction to access the V-Safe system.   Mr. Corey Christensen was instructed to call 911 with any severe reactions post vaccine: Marland Kitchen Difficulty breathing  . Swelling of face and throat  . A fast heartbeat  . A bad rash all over body  . Dizziness and weakness   Immunizations Administered    Name Date Dose VIS Date Route   Moderna COVID-19 Vaccine 01/17/2020  8:32 AM 0.5 mL 08/2019 Intramuscular   Manufacturer: Moderna   Lot: [id number]   NDC: 58063-868-54

## 2020-10-04 ENCOUNTER — Other Ambulatory Visit: Payer: Self-pay

## 2020-10-04 ENCOUNTER — Other Ambulatory Visit: Payer: No Typology Code available for payment source

## 2020-10-04 DIAGNOSIS — Z20822 Contact with and (suspected) exposure to covid-19: Secondary | ICD-10-CM

## 2020-10-07 LAB — NOVEL CORONAVIRUS, NAA: SARS-CoV-2, NAA: NOT DETECTED

## 2022-02-20 NOTE — Progress Notes (Unsigned)
Cardiology Office Note:   Date:  02/21/2022  NAME:  Corey Christensen    MRN: [id number] DOB:  09-23-1955   PCP:  Iona Beard, MD  Cardiologist:  None  Electrophysiologist:  None   Referring MD: Iona Beard, MD   Chief Complaint  Patient presents with   Chest Pain    History of Present Illness:   Corey Christensen is a 66 y.o. male with a hx of DM, HTN, HLD who is being seen today for the evaluation of chest pain at the request of Iona Beard, MD. he reports episodes of chest pain for the past 1 year.  Symptoms occur every 3 weeks.  Described as sharp left-sided chest pain.  Symptoms can last minutes.  Symptoms do not radiate.  No identifiable triggers.  Symptoms occur randomly.  He may notice some triggering with activity while working in the yard.  Symptoms resolved with rest.  No shortness of breath.  No fevers or chills.  No cough or congestion.  No association with food.  CV risk factors include hypertension that is well controlled.  He also has hyperlipidemia.  LDL could be a bit lower.  Most recent A1c 6.4.  Diabetic for roughly 6 years.  He is a never smoker.  No alcohol or drug use is reported.  He reports his mother has congestive heart failure.  He is divorced.  He has 3 children.  He is retired [place] [employer] worker.  EKG demonstrates sinus rhythm with no acute ischemic changes or evidence of infarction.  No recent TSH.  Not anemic.  He is interested in knowing if this is coming from his heart.  Overall suspect this is musculoskeletal or acid reflux.  We discussed further testing to make sure.  Problem List DM -A1c 6.4 HLD -T chol 147, HDL 34, LDL 95, TG 89  Past Medical History: Past Medical History:  Diagnosis Date   Diabetes mellitus without complication ([place])    Hypercholesteremia    Hypertension     Past Surgical History: Past Surgical History:  Procedure Laterality Date   APPENDECTOMY     COLONOSCOPY  06/29/2012   Procedure: COLONOSCOPY;  Surgeon: Danie Binder, MD;   [facility];  Service: Endoscopy;  Laterality: N/A;  12:20 PM    Current Medications: Current Meds  Medication Sig   aspirin EC 81 MG tablet Take 81 mg by mouth daily.   atenolol (TENORMIN) 50 MG tablet Take 50 mg by mouth daily.   lisinopril-hydrochlorothiazide (PRINZIDE,ZESTORETIC) 20-25 MG per tablet Take 1 tablet by mouth daily.   metFORMIN (GLUCOPHAGE) 500 MG tablet Take 500 mg by mouth every morning.   pravastatin (PRAVACHOL) 20 MG tablet Take 20 mg by mouth daily.   sitaGLIPtin-metformin (JANUMET) 50-500 MG tablet Take 1 tablet by mouth daily before supper.     Allergies:    Patient has no known allergies.   Social History: Social History   Socioeconomic History   Marital status: Single    Spouse name: Not on file   Number of children: 3   Years of education: Not on file   Highest education level: Not on file  Occupational History   Not on file  Tobacco Use   Smoking status: Never   Smokeless tobacco: Never  Substance and Sexual Activity   Alcohol use: Yes   Drug use: No   Sexual activity: Not on file  Other Topics Concern   Not on file  Social History Narrative   Not  on file   Social Determinants of Health   Financial Resource Strain: Not on file  Food Insecurity: Not on file  Transportation Needs: Not on file  Physical Activity: Not on file  Stress: Not on file  Social Connections: Not on file     Family History: The patient's family history includes Heart failure in his mother. There is no history of Colon cancer.  ROS:   All other ROS reviewed and negative. Pertinent positives noted in the HPI.     EKGs/Labs/Other Studies Reviewed:   The following studies were personally reviewed by me today:  EKG:  EKG is ordered today.  The ekg ordered today demonstrates NSR 63 bpm with 1AVB, and was personally reviewed by me.   Recent Labs: No results found for requested labs within last 8760 hours.   Recent Lipid Panel No results found for:  CHOL, TRIG, HDL, CHOLHDL, VLDL, LDLCALC, LDLDIRECT  Physical Exam:   VS:  BP (!) 138/96   Pulse 63   Ht 5\' 11"  (1.803 m)   Wt 190 lb 9.6 oz (86.5 kg)   SpO2 99%   BMI 26.58 kg/m    Wt Readings from Last 3 Encounters:  02/21/22 190 lb 9.6 oz (86.5 kg)  03/09/17 170 lb (77.1 kg)  09/11/16 180 lb (81.6 kg)    General: Well nourished, well developed, in no acute distress Head: Atraumatic, normal size  Eyes: PEERLA, EOMI  Neck: Supple, no JVD Endocrine: No thryomegaly Cardiac: Normal S1, S2; RRR; no murmurs, rubs, or gallops Lungs: Clear to auscultation bilaterally, no wheezing, rhonchi or rales  Abd: Soft, nontender, no hepatomegaly  Ext: No edema, pulses 2+ Musculoskeletal: No deformities, BUE and BLE strength normal and equal Skin: Warm and dry, no rashes   Neuro: Alert and oriented to person, place, time, and situation, CNII-XII grossly intact, no focal deficits  Psych: Normal mood and affect   ASSESSMENT:   Corey Christensen is a 66 y.o. male who presents for the following: 1. Precordial pain   2. Mixed hyperlipidemia   3. Primary hypertension     PLAN:   1. Precordial pain -Presents with 1 year of possibly cardiac chest pain.  Described as sharp pain.  Occurs with exertion sometimes and is alleviated by rest.  CV risk factors include hypertension, diabetes and hyperlipidemia.  EKG demonstrates sinus rhythm with no acute ischemic changes.  Given uncertainty of diagnosis would recommend coronary CTA.  He takes atenolol.  Heart rate 63.  He will take 2 atenolol medications 2 hours before the scan.  He had a recent BMP through his primary care physician.  Creatinine 1.11.  No need for repeat kidney panel today.  We will also obtain an echocardiogram.  Given his diabetes I would like to make sure we are not missing anything.  Follow-up dictated by scan.  2. Mixed hyperlipidemia -Currently on pravastatin 20 mg daily.  Most recent LDL 95.  Given diabetes LDL should be less than 70.   We will further titrate statin therapy based on results of his CTA.  3. Primary hypertension -Well-controlled.  No change to medications.      Disposition: Return if symptoms worsen or fail to improve.  Medication Adjustments/Labs and Tests Ordered: Current medicines are reviewed at length with the patient today.  Concerns regarding medicines are outlined above.  Orders Placed This Encounter  Procedures   CT CORONARY MORPH W/CTA COR W/SCORE W/CA W/CM &/OR WO/CM   EKG 12-Lead   ECHOCARDIOGRAM COMPLETE  No orders of the defined types were placed in this encounter.   Patient Instructions  Medication Instructions:  Take 2 tablets of Atenolol before CT scan when scheduled.   *If you need a refill on your cardiac medications before your next appointment, please call your pharmacy*    Testing/Procedures: Your physician has requested that you have cardiac CT. Cardiac computed tomography (CT) is a painless test that uses an x-ray machine to take clear, detailed pictures of your heart. For further information please visit [email or web address]. Please follow instruction sheet as given.  Echocardiogram - Your physician has requested that you have an echocardiogram. Echocardiography is a painless test that uses sound waves to create images of your heart. It provides your doctor with information about the size and shape of your heart and how well your heart's chambers and valves are working. This procedure takes approximately one hour. There are no restrictions for this procedure.     Follow-Up: At [facility], you and your health needs are our priority.  As part of our continuing mission to provide you with exceptional heart care, we have created designated Provider Care Teams.  These Care Teams include your primary Cardiologist (physician) and Advanced Practice Providers (APPs -  Physician Assistants and Nurse Practitioners) who all work together to provide you with the care you need,  when you need it.  We recommend signing up for the patient portal called "MyChart".  Sign up information is provided on this After Visit Summary.  MyChart is used to connect with patients for Virtual Visits (Telemedicine).  Patients are able to view lab/test results, encounter notes, upcoming appointments, etc.  Non-urgent messages can be sent to your provider as well.   To learn more about what you can do with MyChart, go to [email or web address].    Your next appointment:   As needed  The format for your next appointment:   In Person  Provider:   None {     Your cardiac CT will be scheduled at one of the below locations:   [facility] [place], [place] [address] [phone]  If scheduled at [facility], please arrive at the [facility] and Children's Entrance (Entrance C2) of [facility] 30 minutes prior to test start time. You can use the FREE valet parking offered at entrance C (encouraged to control the heart rate for the test)  Proceed to the [facility] Radiology Department (first floor) to check-in and test prep.  All radiology patients and guests should use entrance C2 at [facility], accessed from [facility], even though the hospital's physical address listed is [address].     Please follow these instructions carefully (unless otherwise directed):  Hold all erectile dysfunction medications at least 3 days (72 hrs) prior to test.  On the Night Before the Test: Be sure to Drink plenty of water. Do not consume any caffeinated/decaffeinated beverages or chocolate 12 hours prior to your test. Do not take any antihistamines 12 hours prior to your test.  On the Day of the Test: Drink plenty of water until 1 hour prior to the test. Do not eat any food 4 hours prior to the test. You may take your regular medications prior to the test.  Take 2 tablets of Atenolol 2 hours before the test. HOLD  Furosemide/Hydrochlorothiazide morning of the test.      After the Test: Drink plenty of water. After receiving IV contrast, you may experience a  mild flushed feeling. This is normal. On occasion, you may experience a mild rash up to 24 hours after the test. This is not dangerous. If this occurs, you can take Benadryl 25 mg and increase your fluid intake. If you experience trouble breathing, this can be serious. If it is severe call 911 IMMEDIATELY. If it is mild, please call our office. If you take any of these medications: Glipizide/Metformin, Avandament, Glucavance, please do not take 48 hours after completing test unless otherwise instructed.  We will call to schedule your test 2-4 weeks out understanding that some insurance companies will need an authorization prior to the service being performed.   For non-scheduling related questions, please contact the cardiac imaging nurse navigator should you have any questions/concerns: Marchia Bond, Cardiac Imaging Nurse Navigator Gordy Clement, Cardiac Imaging Nurse Navigator [facility] Direct Office Dial: [phone]   For scheduling needs, including cancellations and rescheduling, please call [place], [phone].                   Signed, Addison Naegeli. Audie Box, MD, [place]  [address] [place], [place] [address] [phone]  02/21/2022 8:59 AM

## 2022-02-21 ENCOUNTER — Encounter: Payer: Self-pay | Admitting: Cardiovascular Disease

## 2022-02-21 ENCOUNTER — Ambulatory Visit (INDEPENDENT_AMBULATORY_CARE_PROVIDER_SITE_OTHER): Payer: No Typology Code available for payment source | Admitting: Cardiovascular Disease

## 2022-02-21 VITALS — BP 138/96 | HR 63 | Ht 71.0 in | Wt 190.6 lb

## 2022-02-21 DIAGNOSIS — E782 Mixed hyperlipidemia: Secondary | ICD-10-CM | POA: Diagnosis not present

## 2022-02-21 DIAGNOSIS — R072 Precordial pain: Secondary | ICD-10-CM | POA: Diagnosis not present

## 2022-02-21 DIAGNOSIS — I10 Essential (primary) hypertension: Secondary | ICD-10-CM | POA: Diagnosis not present

## 2022-02-21 NOTE — Patient Instructions (Addendum)
Medication Instructions:  Take 2 tablets of Atenolol before CT scan when scheduled.   *If you need a refill on your cardiac medications before your next appointment, please call your pharmacy*    Testing/Procedures: Your physician has requested that you have cardiac CT. Cardiac computed tomography (CT) is a painless test that uses an x-ray machine to take clear, detailed pictures of your heart. For further information please visit [email or web address] Please follow instruction sheet as given.  Echocardiogram - Your physician has requested that you have an echocardiogram. Echocardiography is a painless test that uses sound waves to create images of your heart. It provides your doctor with information about the size and shape of your heart and how well your heart's chambers and valves are working. This procedure takes approximately one hour. There are no restrictions for this procedure.     Follow-Up: At [facility], you and your health needs are our priority.  As part of our continuing mission to provide you with exceptional heart care, we have created designated Provider Care Teams.  These Care Teams include your primary Cardiologist (physician) and Advanced Practice Providers (APPs -  Physician Assistants and Nurse Practitioners) who all work together to provide you with the care you need, when you need it.  We recommend signing up for the patient portal called "MyChart".  Sign up information is provided on this After Visit Summary.  MyChart is used to connect with patients for Virtual Visits (Telemedicine).  Patients are able to view lab/test results, encounter notes, upcoming appointments, etc.  Non-urgent messages can be sent to your provider as well.   To learn more about what you can do with MyChart, go to [email or web address].    Your next appointment:   As needed  The format for your next appointment:   In Person  Provider:   None {     Your cardiac CT will be scheduled  at one of the below locations:   [facility] [address] [place], [place] [address] [phone]  If scheduled at [facility], please arrive at the [facility] and Children's Entrance (Entrance C2) of [facility] 30 minutes prior to test start time. You can use the FREE valet parking offered at entrance C (encouraged to control the heart rate for the test)  Proceed to the [facility] Radiology Department (first floor) to check-in and test prep.  All radiology patients and guests should use entrance C2 at [facility], accessed from [facility], even though the hospital's physical address listed is [address].     Please follow these instructions carefully (unless otherwise directed):  Hold all erectile dysfunction medications at least 3 days (72 hrs) prior to test.  On the Night Before the Test: Be sure to Drink plenty of water. Do not consume any caffeinated/decaffeinated beverages or chocolate 12 hours prior to your test. Do not take any antihistamines 12 hours prior to your test.  On the Day of the Test: Drink plenty of water until 1 hour prior to the test. Do not eat any food 4 hours prior to the test. You may take your regular medications prior to the test.  Take 2 tablets of Atenolol 2 hours before the test. HOLD Furosemide/Hydrochlorothiazide morning of the test.      After the Test: Drink plenty of water. After receiving IV contrast, you may experience a mild flushed feeling. This is normal. On occasion, you may experience a mild rash up to 24  hours after the test. This is not dangerous. If this occurs, you can take Benadryl 25 mg and increase your fluid intake. If you experience trouble breathing, this can be serious. If it is severe call 911 IMMEDIATELY. If it is mild, please call our office. If you take any of these medications: Glipizide/Metformin, Avandament, Glucavance, please do not take 48 hours after  completing test unless otherwise instructed.  We will call to schedule your test 2-4 weeks out understanding that some insurance companies will need an authorization prior to the service being performed.   For non-scheduling related questions, please contact the cardiac imaging nurse navigator should you have any questions/concerns: Rockwell Alexandria, Cardiac Imaging Nurse Navigator Larey Brick, Cardiac Imaging Nurse Navigator [facility] Direct Office Dial: [phone]   For scheduling needs, including cancellations and rescheduling, please call [place], [phone].

## 2022-03-06 ENCOUNTER — Ambulatory Visit (HOSPITAL_COMMUNITY)
Admission: RE | Admit: 2022-03-06 | Discharge: 2022-03-06 | Disposition: A | Discharge status: Home / Self Care | Payer: No Typology Code available for payment source | Source: Ambulatory Visit | Attending: Cardiovascular Disease | Admitting: Cardiovascular Disease

## 2022-03-06 DIAGNOSIS — R072 Precordial pain: Secondary | ICD-10-CM | POA: Diagnosis not present

## 2022-03-06 LAB — ECHOCARDIOGRAM COMPLETE
Area-P 1/2: 2.01 cm2
S' Lateral: 3 cm

## 2022-03-06 NOTE — Progress Notes (Signed)
*  PRELIMINARY RESULTS* Echocardiogram 2D Echocardiogram has been performed.  Stacey Drain 03/06/2022, 9:09 AM

## 2022-03-12 ENCOUNTER — Telehealth (HOSPITAL_COMMUNITY): Payer: Self-pay | Admitting: Emergency Medicine

## 2022-03-12 NOTE — Telephone Encounter (Signed)
Reaching out to patient to offer assistance regarding upcoming cardiac imaging study; pt verbalizes understanding of appt date/time, parking situation and where to check in, pre-test NPO status and medications ordered, and verified current allergies; name and call back number provided for further questions should they arise Rockwell Alexandria RN Navigator Cardiac Imaging [facility] [phone] office [phone] cell  Taking 2 tabs of atenolol the AM of test Denies iv isuses  Arrival 1200

## 2022-03-13 ENCOUNTER — Ambulatory Visit (HOSPITAL_COMMUNITY)
Admission: RE | Admit: 2022-03-13 | Discharge: 2022-03-13 | Disposition: A | Discharge status: Home / Self Care | Payer: No Typology Code available for payment source | Source: Ambulatory Visit | Attending: Cardiovascular Disease | Admitting: Cardiovascular Disease

## 2022-03-13 DIAGNOSIS — R072 Precordial pain: Secondary | ICD-10-CM

## 2022-03-13 MED ORDER — IOHEXOL 350 MG/ML SOLN
100.0000 mL | Freq: Once | INTRAVENOUS | Status: AC | PRN
  Administered 2022-03-13: 100 mL via INTRAVENOUS

## 2022-03-13 MED ORDER — METOPROLOL TARTRATE 5 MG/5ML IV SOLN
10.0000 mg | Freq: Once | INTRAVENOUS | Status: AC
  Administered 2022-03-13: 10 mg via INTRAVENOUS

## 2022-03-13 MED ORDER — NITROGLYCERIN 0.4 MG SL SUBL
SUBLINGUAL_TABLET | SUBLINGUAL | Status: AC
  Administered 2022-03-13: 0.8 mg via SUBLINGUAL
  Filled 2022-03-13: qty 2

## 2022-03-13 MED ORDER — METOPROLOL TARTRATE 5 MG/5ML IV SOLN
INTRAVENOUS | Status: AC
  Filled 2022-03-13: qty 10

## 2022-03-13 MED ORDER — NITROGLYCERIN 0.4 MG SL SUBL: 0.8000 mg | SUBLINGUAL_TABLET | Freq: Once | SUBLINGUAL | Status: AC

## 2022-03-20 ENCOUNTER — Other Ambulatory Visit: Payer: Self-pay | Admitting: *Deleted

## 2022-03-20 MED ORDER — ROSUVASTATIN CALCIUM 20 MG PO TABS: 20.0000 mg | ORAL_TABLET | Freq: Every day | ORAL | 3 refills | Status: DC

## 2023-01-01 ENCOUNTER — Encounter: Payer: Self-pay | Admitting: *Deleted

## 2023-03-03 ENCOUNTER — Telehealth: Payer: Self-pay | Admitting: *Deleted

## 2023-03-03 DIAGNOSIS — Z1211 Encounter for screening for malignant neoplasm of colon: Secondary | ICD-10-CM

## 2023-03-03 NOTE — Telephone Encounter (Signed)
  Procedure: Colonoscopy  Height: 6'0 Weight: 190lbs        Have you had a colonoscopy before?  06/29/12, Dr. Darrick Penna  Do you have family history of colon cancer?  no  Do you have a family history of polyps? no  Previous colonoscopy with polyps removed? no  Do you have a history colorectal cancer?   no  Are you diabetic?  Yes, type 2  Do you have a prosthetic or mechanical heart valve? no  Do you have a pacemaker/defibrillator?   no  Have you had endocarditis/atrial fibrillation?  no  Do you use supplemental oxygen/CPAP?  no  Have you had joint replacement within the last 12 months?  no  Do you tend to be constipated or have to use laxatives?  no   Do you have history of alcohol use? If yes, how much and how often.  no  Do you have history or are you using drugs? If yes, what do are you  using?  no  Have you ever had a stroke/heart attack?  no  Have you ever had a heart or other vascular stent placed,?no  Do you take weight loss medication? no  Do you take any blood-thinning medications such as: (Plavix, aspirin, Coumadin, Aggrenox, Brilinta, Xarelto, Eliquis, Pradaxa, Savaysa or Effient)? Aspirin 81mg   If yes we need the name, milligram, dosage and who is prescribing doctor:               Current Outpatient Medications  Medication Sig Dispense Refill   aspirin EC 81 MG tablet Take 81 mg by mouth daily.     atenolol (TENORMIN) 50 MG tablet Take 50 mg by mouth daily.     lisinopril-hydrochlorothiazide (PRINZIDE,ZESTORETIC) 20-25 MG per tablet Take 1 tablet by mouth daily.     metFORMIN (GLUCOPHAGE) 500 MG tablet Take 500 mg by mouth every morning.     rosuvastatin (CRESTOR) 20 MG tablet Take 1 tablet (20 mg total) by mouth daily. 90 tablet 3   sitaGLIPtin-metformin (JANUMET) 50-500 MG tablet Take 1 tablet by mouth daily before supper.     No current facility-administered medications for this visit.    No Known Allergies

## 2023-03-18 ENCOUNTER — Other Ambulatory Visit: Payer: Self-pay | Admitting: Cardiovascular Disease

## 2023-04-16 ENCOUNTER — Other Ambulatory Visit: Payer: Self-pay | Admitting: Cardiovascular Disease

## 2023-04-20 MED ORDER — PEG 3350-KCL-NA BICARB-NACL 420 G PO SOLR: 4000.0000 mL | Freq: Once | ORAL | 0 refills | Status: AC

## 2023-04-20 NOTE — Addendum Note (Signed)
Addended by: Armstead Peaks on: 04/20/2023 11:14 AM   Modules accepted: Orders

## 2023-04-20 NOTE — Telephone Encounter (Signed)
Spoke with pt. He has been scheduled for 8/20 at 10:30am. Aware will send instructions. Rx for prep to pharmacy. Also aware will need BMET prior and will place for [facility] lab.

## 2023-04-20 NOTE — Telephone Encounter (Addendum)
Ok to schedule. ASA 2  Day of prep: Metformin am only Janumet hold  AM of TCS: hold metformin, janumet  HE WILL NEED BMET

## 2023-05-06 ENCOUNTER — Other Ambulatory Visit (HOSPITAL_COMMUNITY)
Admission: RE | Admit: 2023-05-06 | Discharge: 2023-05-06 | Disposition: A | Discharge status: Home / Self Care | Payer: No Typology Code available for payment source | Source: Ambulatory Visit | Attending: Internal Medicine | Admitting: Internal Medicine

## 2023-05-06 DIAGNOSIS — Z1211 Encounter for screening for malignant neoplasm of colon: Secondary | ICD-10-CM | POA: Diagnosis present

## 2023-05-06 LAB — BASIC METABOLIC PANEL
Anion gap: 7 (ref 5–15)
BUN: 24 mg/dL — ABNORMAL HIGH (ref 8–23)
CO2: 26 mmol/L (ref 22–32)
Calcium: 9.4 mg/dL (ref 8.9–10.3)
Chloride: 102 mmol/L (ref 98–111)
Creatinine, Ser: 1.31 mg/dL — ABNORMAL HIGH (ref 0.61–1.24)
GFR, Estimated: >60 mL/min (ref >60)
Glucose, Bld: 112 mg/dL — ABNORMAL HIGH (ref 70–99)
Potassium: 3.9 mmol/L (ref 3.5–5.1)
Sodium: 135 mmol/L (ref 135–145)

## 2023-05-09 IMAGING — CT CT HEART MORP W/ CTA COR W/ SCORE W/ CA W/CM &/OR W/O CM
4 of 7 series · 8 of 20 positions shown, 9 images · IV contrast (APPLIED)
Comparison: None Available.
COMPARISON: None Available.

Addendum:
EXAM:
OVER-READ INTERPRETATION  CT CHEST

The following report is a limited chest CT over-read performed by
03/13/2022. The coronary CTA interpretation by the cardiologist is
attached.
HISTORY: Chest pain/anginal equiv, intermediate CAD risk, not treadmill
candidate
Cardiac/Coronary CT
TECHNIQUE: The patient was scanned on a Siemens Force scanner.
PROTOCOL: A 100 kV prospective scan was triggered in the descending thoracic
aorta at 111 HU's. Axial non-contrast 3 mm slices were carried out
through the heart. The data set was analyzed on a dedicated work
station and scored using the Agatston method. Gantry rotation speed
was 250 msecs and collimation was .6 mm. Heart rate was optimized
medically and sl NTG was given. The 3D data set was reconstructed in
5% intervals of the 35-75 % of the R-R cycle. Systolic and diastolic
phases were analyzed on a dedicated work station using MPR, MIP and
VRT modes. The patient received 100mL OMNIPAQUE IOHEXOL 350 MG/ML
SOLN of contrast.

[Series 6: ts diast sharp · axial · 0.39mm/px · z∈[+1260,+1295]mm · 2 of 266 slices shown]
[im 89/266  lung]
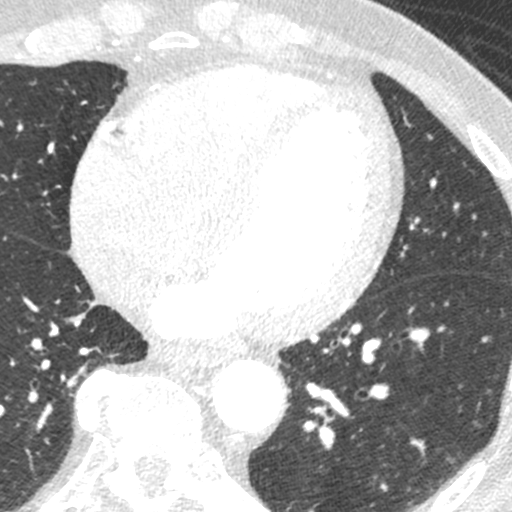
[im 177/266  lung]
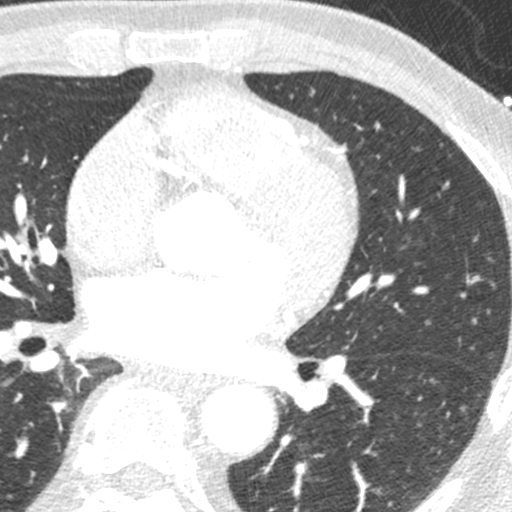

[Series 7: best diast · axial · 0.39mm/px · z∈[+1260,+1295]mm · 2 of 266 slices shown, 3 images]
[im 89/266  vessel]
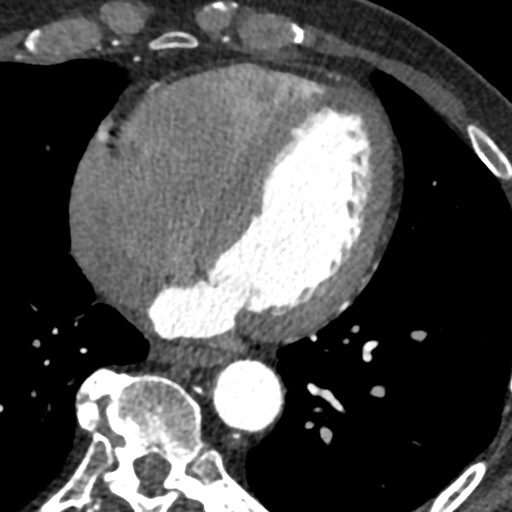
[im 89/266  lung]
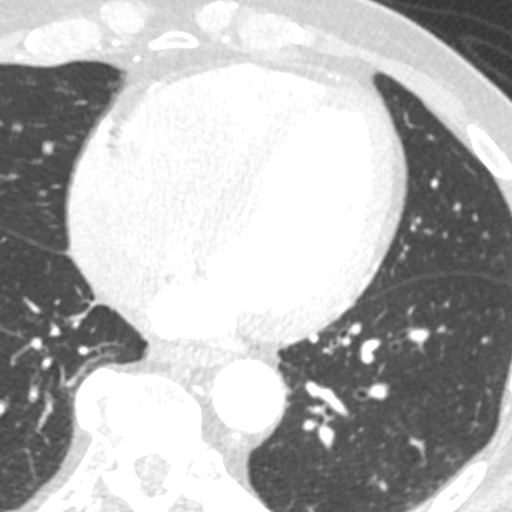
[im 177/266  vessel]
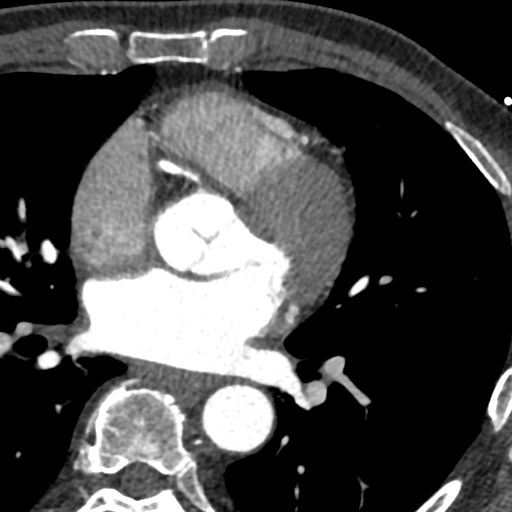

[Series 8: ts syst sharp · axial · 0.39mm/px · z∈[+1260,+1295]mm · 2 of 266 slices shown]
[im 89/266  lung]
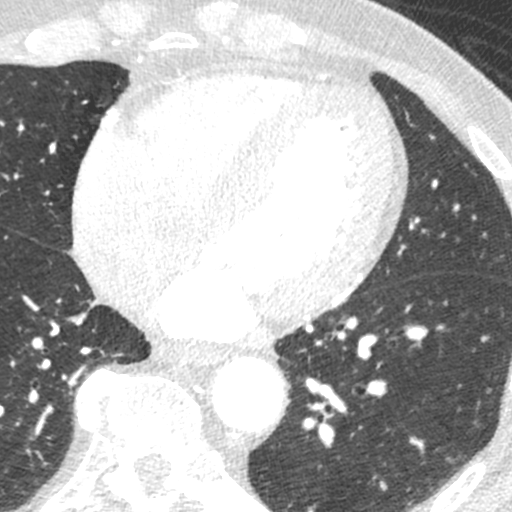
[im 177/266  lung]
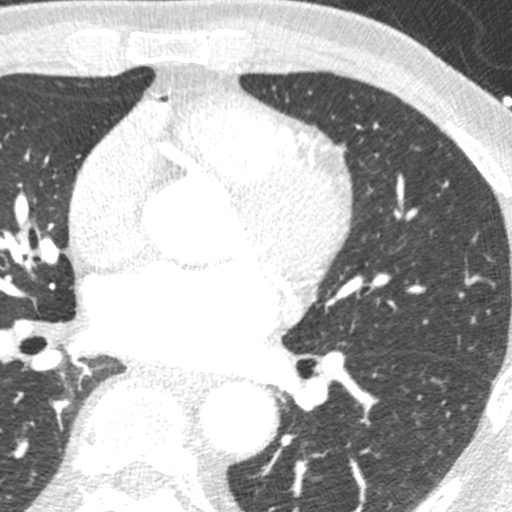

[Series 9: best syst · axial · 0.39mm/px · z∈[+1260,+1295]mm · 2 of 266 slices shown]
[im 89/266  vessel]
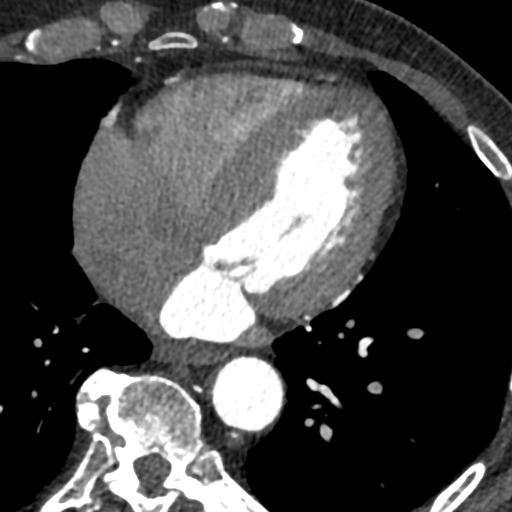
[im 177/266  vessel]
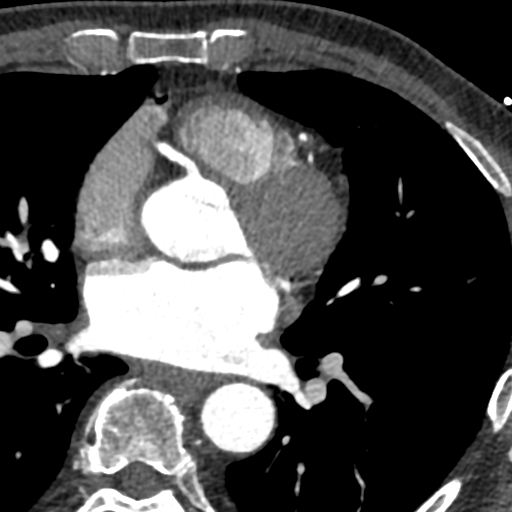

[8 of 20 positions shown; findings below may reference images not displayed]

FINDINGS: Vascular: Visualized extracardiac vascular structures are
unremarkable.

Mediastinum/Nodes: Visualized mediastinum is unremarkable.

Lungs/Pleura: 5 mm subpleural nodule is noted posteriorly in left
lower lobe.

Upper Abdomen: Visualized portion of upper abdomen is unremarkable.

Musculoskeletal: No chest wall mass or suspicious bone lesions
identified.
IMPRESSION: 5 mm subpleural nodule seen in left lower lobe. Although likely
benign, if the patient is high-risk, given the morphology and/or
location of this nodule a non-contrast chest CT can be considered in
12 months.This recommendation follows the consensus statement:
Guidelines for Management of Incidental Pulmonary Nodules Detected
FINDINGS: Coronary calcium score: The patient's coronary artery calcium score
is 13.7, which places the patient in the 35th percentile.

Coronary arteries: Normal coronary origins.  Right dominance.

Right Coronary Artery: Normal caliber vessel, gives rise to PDA. No
significant plaque or stenosis.

Left Main Coronary Artery: Normal caliber vessel. No significant
plaque or stenosis.

Left Anterior Descending Coronary Artery: Normal caliber vessel. No
significant plaque or stenosis. Gives rise to 4 small diagonal
branches.

Left Circumflex Artery: Normal caliber vessel. Trivial focal
calcified plaque without significant stenosis. Gives rise to small
first and branching second OM branches.

Aorta: Normal size, 34 mm at the mid ascending aorta (level of the
PA bifurcation) measured double oblique. No aortic atherosclerosis.
No dissection seen in visualized portions of the aorta.

Aortic Valve: Trivial calcifications. Trileaflet.

Other findings:

Normal pulmonary vein drainage into the left atrium.

Normal left atrial appendage without a thrombus.

Normal size of the pulmonary artery.

Normal appearance of the pericardium.
IMPRESSION: 1. Minimal nonobstructive CAD, CADRADS = 1.

2. Coronary calcium score of 13.7. This was 35th percentile for age
and sex matched control.

3. Normal coronary origin with right dominance.

INTERPRETATION:

1. CAD-RADS 0: No evidence of CAD (0%). Consider non-atherosclerotic
causes of chest pain.

2. CAD-RADS 1: Minimal non-obstructive CAD (0-24%). Consider
non-atherosclerotic causes of chest pain. Consider preventive
therapy and risk factor modification.

3. CAD-RADS 2: Mild non-obstructive CAD (25-49%). Consider
non-atherosclerotic causes of chest pain. Consider preventive
therapy and risk factor modification.

4. CAD-RADS 3: Moderate stenosis (50-69%). Consider symptom-guided
anti-ischemic pharmacotherapy as well as risk factor modification
per guideline directed care. Additional analysis with CT FFR will be
submitted.

5. CAD-RADS 4: Severe stenosis. (70-99% or > 50% left main). Cardiac
catheterization or CT FFR is recommended. Consider symptom-guided
anti-ischemic pharmacotherapy as well as risk factor modification
per guideline directed care. Invasive coronary angiography
recommended with revascularization per published guideline
statements.

6. CAD-RADS 5: Total coronary occlusion (100%). Consider cardiac
catheterization or viability assessment. Consider symptom-guided
anti-ischemic pharmacotherapy as well as risk factor modification
per guideline directed care.

7. CAD-RADS N: Non-diagnostic study. Obstructive CAD can't be
excluded. Alternative evaluation is recommended.

*** End of Addendum ***
EXAM:
OVER-READ INTERPRETATION  CT CHEST

The following report is a limited chest CT over-read performed by
03/13/2022. The coronary CTA interpretation by the cardiologist is
attached.
FINDINGS: Vascular: Visualized extracardiac vascular structures are
unremarkable.

Mediastinum/Nodes: Visualized mediastinum is unremarkable.

Lungs/Pleura: 5 mm subpleural nodule is noted posteriorly in left
lower lobe.

Upper Abdomen: Visualized portion of upper abdomen is unremarkable.

Musculoskeletal: No chest wall mass or suspicious bone lesions
identified.
IMPRESSION: 5 mm subpleural nodule seen in left lower lobe. Although likely
benign, if the patient is high-risk, given the morphology and/or
location of this nodule a non-contrast chest CT can be considered in
12 months.This recommendation follows the consensus statement:
Guidelines for Management of Incidental Pulmonary Nodules Detected

## 2023-05-12 ENCOUNTER — Encounter (HOSPITAL_COMMUNITY): Payer: Self-pay

## 2023-05-12 ENCOUNTER — Ambulatory Visit (HOSPITAL_COMMUNITY)
Admission: RE | Admit: 2023-05-12 | Discharge: 2023-05-12 | Disposition: A | Discharge status: Home / Self Care | Payer: No Typology Code available for payment source | Source: Ambulatory Visit | Attending: Internal Medicine | Admitting: Internal Medicine

## 2023-05-12 ENCOUNTER — Other Ambulatory Visit: Payer: Self-pay

## 2023-05-12 ENCOUNTER — Encounter (HOSPITAL_COMMUNITY)
Admission: RE | Disposition: A | Discharge status: Home / Self Care | Payer: Self-pay | Source: Ambulatory Visit | Attending: Internal Medicine

## 2023-05-12 ENCOUNTER — Ambulatory Visit (HOSPITAL_COMMUNITY): Payer: No Typology Code available for payment source | Admitting: Certified Registered"

## 2023-05-12 DIAGNOSIS — Z1211 Encounter for screening for malignant neoplasm of colon: Secondary | ICD-10-CM | POA: Insufficient documentation

## 2023-05-12 DIAGNOSIS — I10 Essential (primary) hypertension: Secondary | ICD-10-CM | POA: Diagnosis not present

## 2023-05-12 DIAGNOSIS — K648 Other hemorrhoids: Secondary | ICD-10-CM | POA: Diagnosis not present

## 2023-05-12 DIAGNOSIS — E119 Type 2 diabetes mellitus without complications: Secondary | ICD-10-CM | POA: Insufficient documentation

## 2023-05-12 DIAGNOSIS — D123 Benign neoplasm of transverse colon: Secondary | ICD-10-CM | POA: Diagnosis not present

## 2023-05-12 DIAGNOSIS — K635 Polyp of colon: Secondary | ICD-10-CM | POA: Diagnosis not present

## 2023-05-12 HISTORY — PX: POLYPECTOMY: SHX5525

## 2023-05-12 HISTORY — PX: COLONOSCOPY WITH PROPOFOL: SHX5780

## 2023-05-12 LAB — GLUCOSE, CAPILLARY: Glucose-Capillary: 101 mg/dL — ABNORMAL HIGH (ref 70–99)

## 2023-05-12 SURGERY — COLONOSCOPY WITH PROPOFOL
Anesthesia: General

## 2023-05-12 MED ORDER — LIDOCAINE HCL (PF) 2 % IJ SOLN
INTRAMUSCULAR | Status: AC
  Filled 2023-05-12: qty 5

## 2023-05-12 MED ORDER — PROPOFOL 10 MG/ML IV BOLUS
INTRAVENOUS | Status: DC | PRN
  Administered 2023-05-12: 130 mg via INTRAVENOUS

## 2023-05-12 MED ORDER — PROPOFOL 500 MG/50ML IV EMUL
INTRAVENOUS | Status: DC | PRN
  Administered 2023-05-12: 150 ug/kg/min via INTRAVENOUS

## 2023-05-12 MED ORDER — LACTATED RINGERS IV SOLN: INTRAVENOUS | Status: DC

## 2023-05-12 MED ORDER — STERILE WATER FOR IRRIGATION IR SOLN
Status: DC | PRN
  Administered 2023-05-12: 100 mL

## 2023-05-12 MED ORDER — LACTATED RINGERS IV SOLN: INTRAVENOUS | Status: DC | PRN

## 2023-05-12 MED ORDER — LIDOCAINE HCL (CARDIAC) PF 100 MG/5ML IV SOSY
PREFILLED_SYRINGE | INTRAVENOUS | Status: DC | PRN
  Administered 2023-05-12: 80 mg via INTRAVENOUS

## 2023-05-12 NOTE — Anesthesia Preprocedure Evaluation (Signed)
Anesthesia Evaluation  Patient identified by MRN, date of birth, ID band Patient awake    Reviewed: Allergy & Precautions, H&P , NPO status , Patient's Chart, lab work & pertinent test results, reviewed documented beta blocker date and time   Airway Mallampati: II  TM Distance: >3 FB Neck ROM: full    Dental no notable dental hx.    Pulmonary neg pulmonary ROS   Pulmonary exam normal breath sounds clear to auscultation       Cardiovascular Exercise Tolerance: Good hypertension, negative cardio ROS  Rhythm:regular Rate:Normal     Neuro/Psych negative neurological ROS  negative psych ROS   GI/Hepatic negative GI ROS, Neg liver ROS,,,  Endo/Other  negative endocrine ROSdiabetes    Renal/GU negative Renal ROS  negative genitourinary   Musculoskeletal   Abdominal   Peds  Hematology negative hematology ROS (+)   Anesthesia Other Findings   Reproductive/Obstetrics negative OB ROS                             Anesthesia Physical Anesthesia Plan  ASA: 2  Anesthesia Plan: General   Post-op Pain Management:    Induction:   PONV Risk Score and Plan: Propofol infusion  Airway Management Planned:   Additional Equipment:   Intra-op Plan:   Post-operative Plan:   Informed Consent: I have reviewed the patients History and Physical, chart, labs and discussed the procedure including the risks, benefits and alternatives for the proposed anesthesia with the patient or authorized representative who has indicated his/her understanding and acceptance.     Dental Advisory Given  Plan Discussed with: CRNA  Anesthesia Plan Comments:        Anesthesia Quick Evaluation  

## 2023-05-12 NOTE — H&P (Signed)
Primary Care Physician:  Mirna Mires, MD Primary Gastroenterologist:  Dr. Marletta Lor  Pre-Procedure History & Physical: HPI:  Corey Christensen is a 67 y.o. male is here for a colonoscopy for colon cancer screening purposes.  Patient denies any family history of colorectal cancer.  No melena or hematochezia.  No abdominal pain or unintentional weight loss.  No change in bowel habits.  Overall feels well from a GI standpoint.  Past Medical History:  Diagnosis Date   Diabetes mellitus without complication (HCC)    Hypercholesteremia    Hypertension     Past Surgical History:  Procedure Laterality Date   APPENDECTOMY     COLONOSCOPY  06/29/2012   Procedure: COLONOSCOPY;  Surgeon: West Bali, MD;  Location: [facility];  Service: Endoscopy;  Laterality: N/A;  12:20 PM    Prior to Admission medications   Medication Sig Start Date End Date Taking? Authorizing Provider  aspirin EC 81 MG tablet Take 81 mg by mouth daily.   Yes [provider]  atenolol (TENORMIN) 50 MG tablet Take 50 mg by mouth daily.   Yes [provider]  lisinopril-hydrochlorothiazide (PRINZIDE,ZESTORETIC) 20-25 MG per tablet Take 1 tablet by mouth daily.   Yes [provider]  metFORMIN (GLUCOPHAGE) 500 MG tablet Take 500 mg by mouth every morning. 02/04/22  Yes [provider]  rosuvastatin (CRESTOR) 20 MG tablet TAKE 1 TABLET BY MOUTH DAILY. PATIENT MUST SCHEDULE APPOINTMENT FOR FUTURE REFILLS FIRST ATTEMPT 04/16/23  Yes O'Neal, Ronnald Ramp, MD  sitaGLIPtin-metformin (JANUMET) 50-500 MG tablet Take 1 tablet by mouth daily before supper.    [provider]    Allergies as of 04/20/2023   (No Known Allergies)    Family History  Problem Relation Age of Onset   Heart failure Mother    Colon cancer Neg Hx     Social History   Socioeconomic History   Marital status: Single    Spouse name: Not on file   Number of children: 3   Years of education: Not on file    Highest education level: Not on file  Occupational History   Not on file  Tobacco Use   Smoking status: Never   Smokeless tobacco: Never  Substance and Sexual Activity   Alcohol use: Yes   Drug use: No   Sexual activity: Not on file  Other Topics Concern   Not on file  Social History Narrative   Not on file   Social Determinants of Health   Financial Resource Strain: Not on file  Food Insecurity: Not on file  Transportation Needs: Not on file  Physical Activity: Not on file  Stress: Not on file  Social Connections: Not on file  Intimate Partner Violence: Not on file    Review of Systems: See HPI, otherwise negative ROS  Physical Exam: Vital signs in last 24 hours: Temp:  [98.5 F (36.9 C)] 98.5 F (36.9 C) (08/20 0951) Pulse Rate:  [58] 58 (08/20 0951) Resp:  [13] 13 (08/20 0951) BP: (127)/(86) 127/86 (08/20 0951) SpO2:  [99 %] 99 % (08/20 0951) Weight:  [83.9 kg] 83.9 kg (08/20 0951)   General:   Alert,  Well-developed, well-nourished, pleasant and cooperative in NAD Head:  Normocephalic and atraumatic. Eyes:  Sclera clear, no icterus.   Conjunctiva pink. Ears:  Normal auditory acuity. Nose:  No deformity, discharge,  or lesions. Msk:  Symmetrical without gross deformities. Normal posture. Extremities:  Without clubbing or edema. Neurologic:  Alert and  oriented  x4;  grossly normal neurologically. Skin:  Intact without significant lesions or rashes. Psych:  Alert and cooperative. Normal mood and affect.  Impression/Plan: Corey Christensen is here for a colonoscopy to be performed for colon cancer screening purposes.  The risks of the procedure including infection, bleed, or perforation as well as benefits, limitations, alternatives and imponderables have been reviewed with the patient. Questions have been answered. All parties agreeable.

## 2023-05-12 NOTE — Op Note (Signed)
[facility] Patient Name: Corey Christensen Procedure Date: 05/12/2023 10:41 AM MRN: [id number] Date of Birth: 1956/04/05 Attending MD: Hennie Duos. Marletta Lor , [place], [id number] CSN: [id number] Age: 67 Admit Type: Outpatient Procedure:                Colonoscopy Indications:              Screening for colorectal malignant neoplasm Providers:                Hennie Duos. Marletta Lor, DO, Buel Ream. Thomasena Edis RN, RN,                            Pandora Leiter, Technician Referring MD:              Medicines:                See the Anesthesia note for documentation of the                            administered medications Complications:            No immediate complications. Estimated Blood Loss:     Estimated blood loss was minimal. Procedure:                Pre-Anesthesia Assessment:                           - The anesthesia plan was to use monitored                            anesthesia care (MAC).                           After obtaining informed consent, the colonoscope                            was passed under direct vision. Throughout the                            procedure, the patient's blood pressure, pulse, and                            oxygen saturations were monitored continuously. The                            [id number]-HQ190L ([id number]) scope was introduced through                            the anus and advanced to the the cecum, identified                            by appendiceal orifice and ileocecal valve. The                            colonoscopy was performed without difficulty. The                            patient tolerated the  procedure well. The quality                            of the bowel preparation was evaluated using the                            BBPS [facility] Bowel Preparation Scale) with scores                            of: Right Colon = 3, Transverse Colon = 3 and Left                            Colon = 3 (entire mucosa seen well with no residual                             staining, small fragments of stool or opaque                            liquid). The total BBPS score equals 9. Scope In: 10:48:45 AM Scope Out: 11:00:28 AM Scope Withdrawal Time: 0 hours 10 minutes 3 seconds  Total Procedure Duration: 0 hours 11 minutes 43 seconds  Findings:      Non-bleeding internal hemorrhoids were found during endoscopy.      A 6 mm polyp was found in the transverse colon. The polyp was sessile.       The polyp was removed with a cold snare. Resection and retrieval were       complete.      A 5 mm polyp was found in the sigmoid colon. The polyp was sessile. The       polyp was removed with a cold snare. Resection and retrieval were       complete.      The exam was otherwise without abnormality. Impression:               - Non-bleeding internal hemorrhoids.                           - One 6 mm polyp in the transverse colon, removed                            with a cold snare. Resected and retrieved.                           - One 5 mm polyp in the sigmoid colon, removed with                            a cold snare. Resected and retrieved.                           - The examination was otherwise normal. Moderate Sedation:      Per Anesthesia Care Recommendation:           - Patient has a contact number available for  emergencies. The signs and symptoms of potential                            delayed complications were discussed with the                            patient. Return to normal activities tomorrow.                            Written discharge instructions were provided to the                            patient.                           - Resume previous diet.                           - Continue present medications.                           - Await pathology results.                           - Repeat colonoscopy in 7 years for surveillance.                           - Return to GI clinic PRN. Procedure Code(s):         --- Professional ---                           [phone], Colonoscopy, flexible; with removal of                            tumor(s), polyp(s), or other lesion(s) by snare                            technique Diagnosis Code(s):        --- Professional ---                           Z12.11, Encounter for screening for malignant                            neoplasm of colon                           D12.3, Benign neoplasm of transverse colon (hepatic                            flexure or splenic flexure)                           D12.5, Benign neoplasm of sigmoid colon                           K64.8, Other hemorrhoids CPT copyright 2022  American Medical Association. All rights reserved. The codes documented in this report are preliminary and upon coder review may  be revised to meet current compliance requirements. Hennie Duos. Marletta Lor, DO Hennie Duos. Marletta Lor, DO 05/12/2023 11:03:29 AM This report has been signed electronically. Number of Addenda: 0

## 2023-05-12 NOTE — Transfer of Care (Addendum)
Immediate Anesthesia Transfer of Care Note  Patient: Corey Christensen  Procedure(s) Performed: COLONOSCOPY WITH PROPOFOL POLYPECTOMY  Patient Location: Short Stay  Anesthesia Type:General  Level of Consciousness: drowsy and patient cooperative  Airway & Oxygen Therapy: Patient Spontanous Breathing and Patient connected to nasal cannula oxygen  Post-op Assessment: Report given to RN and Post -op Vital signs reviewed and stable  Post vital signs: Reviewed and stable  Last Vitals:  Vitals Value Taken Time  BP 99/67 05/12/23   1105  Temp 36.9 05/12/23   1103  Pulse 61 05/12/23   1103  Resp 19 05/12/23   1103  SpO2 100% 05/12/23   1103    Last Pain:  Vitals:   05/12/23 1045  TempSrc:   PainSc: 0-No pain      Patients Stated Pain Goal: 8 (05/12/23 0951)  Complications: No notable events documented.

## 2023-05-12 NOTE — Discharge Instructions (Addendum)
  Colonoscopy Discharge Instructions  Read the instructions outlined below and refer to this sheet in the next few weeks. These discharge instructions provide you with general information on caring for yourself after you leave the hospital. Your doctor may also give you specific instructions. While your treatment has been planned according to the most current medical practices available, unavoidable complications occasionally occur.   ACTIVITY You may resume your regular activity, but move at a slower pace for the next 24 hours.  Take frequent rest periods for the next 24 hours.  Walking will help get rid of the air and reduce the bloated feeling in your belly (abdomen).  No driving for 24 hours (because of the medicine (anesthesia) used during the test).   Do not sign any important legal documents or operate any machinery for 24 hours (because of the anesthesia used during the test).  NUTRITION Drink plenty of fluids.  You may resume your normal diet as instructed by your doctor.  Begin with a light meal and progress to your normal diet. Heavy or fried foods are harder to digest and may make you feel sick to your stomach (nauseated).  Avoid alcoholic beverages for 24 hours or as instructed.  MEDICATIONS You may resume your normal medications unless your doctor tells you otherwise.  WHAT YOU CAN EXPECT TODAY Some feelings of bloating in the abdomen.  Passage of more gas than usual.  Spotting of blood in your stool or on the toilet paper.  IF YOU HAD POLYPS REMOVED DURING THE COLONOSCOPY: No aspirin products for 7 days or as instructed.  No alcohol for 7 days or as instructed.  Eat a soft diet for the next 24 hours.  FINDING OUT THE RESULTS OF YOUR TEST Not all test results are available during your visit. If your test results are not back during the visit, make an appointment with your caregiver to find out the results. Do not assume everything is normal if you have not heard from your  caregiver or the medical facility. It is important for you to follow up on all of your test results.  SEEK IMMEDIATE MEDICAL ATTENTION IF: You have more than a spotting of blood in your stool.  Your belly is swollen (abdominal distention).  You are nauseated or vomiting.  You have a temperature over 101.  You have abdominal pain or discomfort that is severe or gets worse throughout the day.   Your colonoscopy revealed 2 polyp(s) which I removed successfully. Await pathology results, my office will contact you. I recommend repeating colonoscopy in 7 years for surveillance purposes.   Otherwise follow up with GI as needed.   I hope you have a great rest of your week!  Elon Alas. Abbey Chatters, D.O. Gastroenterology and Hepatology [facility]

## 2023-05-13 LAB — SURGICAL PATHOLOGY

## 2023-05-13 NOTE — Anesthesia Postprocedure Evaluation (Signed)
Anesthesia Post Note  Patient: Corey Christensen  Procedure(s) Performed: COLONOSCOPY WITH PROPOFOL POLYPECTOMY  Patient location during evaluation: Phase II Anesthesia Type: General Level of consciousness: awake Pain management: pain level controlled Vital Signs Assessment: post-procedure vital signs reviewed and stable Respiratory status: spontaneous breathing and respiratory function stable Cardiovascular status: blood pressure returned to baseline and stable Postop Assessment: no headache and no apparent nausea or vomiting Anesthetic complications: no Comments: Late entry   No notable events documented.   Last Vitals:  Vitals:   05/12/23 1103 05/12/23 1105  BP: (!) 89/61 99/67  Pulse: 61   Resp: 19   Temp: 36.9 C   SpO2: 100%     Last Pain:  Vitals:   05/12/23 1105  TempSrc:   PainSc: 0-No pain                 Windell Norfolk

## 2023-05-14 ENCOUNTER — Encounter (HOSPITAL_COMMUNITY): Payer: Self-pay | Admitting: Internal Medicine

## 2023-07-27 ENCOUNTER — Other Ambulatory Visit: Payer: Self-pay | Admitting: Cardiovascular Disease

## 2023-09-29 DIAGNOSIS — E1169 Type 2 diabetes mellitus with other specified complication: Secondary | ICD-10-CM | POA: Diagnosis not present

## 2023-09-29 DIAGNOSIS — I10 Essential (primary) hypertension: Secondary | ICD-10-CM | POA: Diagnosis not present

## 2024-01-28 ENCOUNTER — Other Ambulatory Visit: Payer: Self-pay | Admitting: Cardiovascular Disease

## 2024-02-07 ENCOUNTER — Other Ambulatory Visit: Payer: Self-pay | Admitting: Cardiovascular Disease

## 2024-02-28 ENCOUNTER — Other Ambulatory Visit: Payer: Self-pay | Admitting: Cardiovascular Disease

## 2024-04-03 ENCOUNTER — Other Ambulatory Visit: Payer: Self-pay | Admitting: Cardiovascular Disease

## 2024-04-08 ENCOUNTER — Encounter: Payer: Self-pay | Admitting: Advanced Practice Midwife

## 2024-08-11 ENCOUNTER — Telehealth: Payer: Self-pay

## 2024-08-11 NOTE — Progress Notes (Signed)
   08/11/2024 Name: Corey Christensen MRN: [id number] DOB: 1955-11-28  Chief Complaint  Patient presents with   Medication Assistance    Januvia    2025 Medication Assistance Application Summary:  Patient was outreached regarding medication assistance for 2025. Verified address, insurance for 2025, and income. Patient is interested in PAP for 2025 for Januvia, no other new medications were identified for medication assistance.    Medication Assistance Findings:  Medication assistance needs identified: Januvia 50 mg      Additional medication assistance options reviewed with patient as warranted:  No other options identified   Plan: I will route patient assistance letter to pharmacy technician who will coordinate patient assistance program application process for medications listed above.  Pharmacy technician will assist with obtaining all required documents from both patient and provider(s) and submit application(s) once completed.    Thank you for allowing pharmacy to be a part of this patient's care.   Heather Factor, PharmD Clinical Pharmacist  [phone]

## 2024-08-12 ENCOUNTER — Telehealth: Payer: Self-pay

## 2024-08-12 NOTE — Telephone Encounter (Signed)
 PAP: Patient assistance application for Januvia through [employer] has been mailed to pt's home address on file. Provider portion of application will be faxed to provider's office.  Provider portion has been faxed to Dr. Elna Potters at [facility]

## 2024-08-30 NOTE — Telephone Encounter (Signed)
Received patient portion of application.

## 2024-09-20 ENCOUNTER — Other Ambulatory Visit: Payer: Self-pay | Admitting: Cardiovascular Disease

## 2024-10-06 NOTE — Telephone Encounter (Signed)
 Waiting on Dr. Leigh to return his portion of patient assistance application

## 2024-10-06 NOTE — Telephone Encounter (Signed)
 PAP: Application for Januvia has been submitted to [employer], via fax

## 2024-10-15 ENCOUNTER — Other Ambulatory Visit: Payer: Self-pay | Admitting: Cardiovascular Disease

## 2024-10-21 NOTE — Telephone Encounter (Signed)
 In accordance with refill protocols, please review and address the following requirements before this medication refill can be authorized:  Labs  Pt needs a lipid panel done within 12 months this is not done

## 2024-10-27 NOTE — Telephone Encounter (Signed)
 Spoke with [employer] attestation form was mailed 1/19

## 2024-10-28 ENCOUNTER — Telehealth: Payer: Self-pay

## 2024-10-28 NOTE — Progress Notes (Signed)
 "  10/28/2024 Name: Corey Christensen MRN: [id number] DOB: 1956/01/25  Chief Complaint  Patient presents with   Medication Management   Medication Assistance    Corey Christensen is a 69 y.o. year old male who presented for a telephone visit.   They were referred to the pharmacist by their PCP for assistance in managing medication access.    Subjective: Called patient to follow up on Januvia PAP. He has not yet received the attestation form from [employer]. He mentioned some recent dose adjustments with his other medications. I performed a medication review today. The patient has his prescription vials available at the time of medication review.  Care Team: Primary Care Provider: Leigh Lung, MD ; Next Scheduled Visit: 11/01/24   Medication Access/Adherence  Current Pharmacy:  [id number] - [place], [place] - [address] AT [facility] [address] [place] [address] Phone: [phone] Fax: [phone]   Patient reports affordability concerns with their medications: Yes  Patient reports access/transportation concerns to their pharmacy: No  Patient reports adherence concerns with their medications:  No     Medication Management:  Current adherence strategy: Patient is getting [facility]-day [facility]  Patient reports Good adherence to medications  Patient reports the following barriers to adherence: Financial barriers to Januvia. He reports increased diarrhea and GI issues with metformin dose increase. Reports running low on Atenolol and Lisinopril-hydrochlorothiazide due to dosage adjustments - prescriptions were not updated to reflect new dosages.  **BP was elevated at last OV, Hydralazine 25 mg daily at bedtime was mentioned in note, patient needs prescription sent in for this medication.   Recent fill dates:   Lisinopril-hydrochlorothiazide 20-25 mg- last filled 10/28/24 for a 90-day supply  Rosuvastatin  20 mg - last filled 10/21/24 for a 30-day  supply  Metformin 1000 mg - last filled 10/13/24 for a 90-day supply  Hydrochlorothiazide 25 mg - last filled 10/04/24 for a 90-day supply  Atenolol 50 mg - last filled 08/21/24 for a 90-day supply   Objective:   Medications Reviewed Today     Reviewed by Graylon Keen, [facility] (Pharmacist) on 10/28/24 at 1621  Med List Status: <None>   Medication Order Taking? Sig Documenting Provider Last Dose Status Informant  aspirin EC 81 MG tablet [id number] Yes Take 81 mg by mouth daily. [provider]  Active Self    Discontinued 10/28/24 1616 (Discontinued by provider) atenolol (TENORMIN) 50 MG tablet [id number] Yes Take 50 mg by mouth daily.  Patient taking differently: Take 100 mg by mouth daily.   [provider]  Active   hydrochlorothiazide (HYDRODIURIL) 25 MG tablet [id number] Yes Take 25 mg by mouth daily. [provider]  Active     Discontinued 10/28/24 1616 (Discontinued by provider) lisinopril-hydrochlorothiazide (ZESTORETIC) 20-25 MG tablet [id number] Yes TAKE 1 TABLET BY MOUTH EVERY DAY FOR BLOOD PRESSURE [provider]  Active   metFORMIN (GLUCOPHAGE) 1000 MG tablet [id number] Yes Take 1,000 mg by mouth daily. [provider]  Active     Discontinued 10/28/24 1616 (Discontinued by provider)     Discontinued 10/28/24 1616 (Discontinued by provider)   rosuvastatin  (CRESTOR ) 20 MG tablet [id number] Yes Take 20 mg by mouth daily. [provider]  Active   sitaGLIPtin (JANUVIA) 50 MG tablet [id number] Yes Take 50 mg by mouth daily. [provider]  Active     Discontinued 10/28/24 1616 (Discontinued by provider)             Assessment/Plan:  Medication Management: - Currently strategy sufficient to maintain appropriate adherence to prescribed medication regimen - Suggested use of weekly pill box to organize medications - Recommend changing metformin to ER formulation to reduce GI side effects, and updating prescriptions for  atenolol 100 mg daily and lisinopril-hydrochlorothiazide 20-25 mg one and one-half tablets daily to reflect current dosages - Will collaborate with PCP to clarify hydrochlorothiazide dose and hydralazine initiation - Discussed list of medication, indication, and administration time.  - Will provide attestation form for patient to sign at upcoming visit on 11/01/24   Follow Up Plan: Will follow up with patient next week  Heather Factor, PharmD Clinical Pharmacist  [phone]     "

## 2024-11-15 ENCOUNTER — Ambulatory Visit: Admitting: Physician Assistant
